# Patient Record
Sex: Female | Born: 1990 | Race: White | Hispanic: No | Marital: Single
Health system: Southern US, Academic
[De-identification: ages and names within clinical notes are randomized; demographics above are authoritative.]

## PROBLEM LIST (undated history)

## (undated) DIAGNOSIS — F1910 Other psychoactive substance abuse, uncomplicated: Secondary | ICD-10-CM

## (undated) DIAGNOSIS — Z789 Other specified health status: Secondary | ICD-10-CM

---
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
## 2011-03-17 LAB — GC/CHLAMYDIA PROBE AMP, URINE
Chlamydia, Swab/Urine, PCR: NEGATIVE
GC Probe Amp, Urine: NEGATIVE

## 2011-03-17 LAB — T4, FREE: Free T4: 0.95

## 2011-03-17 LAB — URINALYSIS, ROUTINE W REFLEX MICROSCOPIC
Glucose, UA: NEGATIVE
Hgb urine dipstick: NEGATIVE
Specific Gravity, Urine: 1.032 — ABNORMAL HIGH

## 2011-03-17 LAB — HEPATIC FUNCTION PANEL
Bilirubin, Direct: 0.1
Total Bilirubin: 0.7

## 2011-03-17 LAB — GAMMA GT: GGT: 11

## 2011-03-17 LAB — URINE CULTURE: Special Requests: NEGATIVE

## 2011-03-17 LAB — RPR: RPR Ser Ql: NONREACTIVE

## 2011-03-29 ENCOUNTER — Observation Stay: Payer: Self-pay | Admitting: Obstetrics and Gynecology

## 2011-06-03 ENCOUNTER — Observation Stay: Payer: Self-pay | Admitting: Obstetrics and Gynecology

## 2011-06-19 ENCOUNTER — Observation Stay: Payer: Self-pay

## 2011-06-29 ENCOUNTER — Observation Stay: Payer: Self-pay

## 2011-06-29 LAB — URINALYSIS, COMPLETE
Bilirubin,UR: NEGATIVE
Glucose,UR: NEGATIVE mg/dL (ref 0–75)
Ketone: NEGATIVE
Specific Gravity: 1.004 (ref 1.003–1.030)
WBC UR: 2 /HPF (ref 0–5)

## 2011-07-05 ENCOUNTER — Observation Stay: Payer: Self-pay | Admitting: Obstetrics and Gynecology

## 2011-07-06 ENCOUNTER — Observation Stay: Payer: Self-pay

## 2011-07-14 ENCOUNTER — Observation Stay: Payer: Self-pay

## 2011-07-14 LAB — CBC WITH DIFFERENTIAL/PLATELET
Basophil %: 0.3 %
Eosinophil %: 1.3 %
Lymphocyte #: 2.1 10*3/uL (ref 1.0–3.6)
MCH: 31.3 pg (ref 26.0–34.0)
MCHC: 33.9 g/dL (ref 32.0–36.0)
MCV: 92 fL (ref 80–100)
Monocyte #: 0.9 10*3/uL — ABNORMAL HIGH (ref 0.0–0.7)
Neutrophil #: 7.1 10*3/uL — ABNORMAL HIGH (ref 1.4–6.5)

## 2011-07-17 ENCOUNTER — Observation Stay: Payer: Self-pay | Admitting: Obstetrics and Gynecology

## 2011-07-18 ENCOUNTER — Inpatient Hospital Stay: Payer: Self-pay

## 2011-07-19 LAB — CBC WITH DIFFERENTIAL/PLATELET
Basophil %: 0.1 %
Eosinophil %: 0.1 %
HCT: 36.1 % (ref 35.0–47.0)
HGB: 12.3 g/dL (ref 12.0–16.0)
Lymphocyte #: 1.4 10*3/uL (ref 1.0–3.6)
MCH: 31.1 pg (ref 26.0–34.0)
MCV: 92 fL (ref 80–100)
Monocyte #: 0.9 10*3/uL — ABNORMAL HIGH (ref 0.0–0.7)
Monocyte %: 5.8 %
Platelet: 197 10*3/uL (ref 150–440)
RBC: 3.94 10*6/uL (ref 3.80–5.20)
WBC: 16.1 10*3/uL — ABNORMAL HIGH (ref 3.6–11.0)

## 2011-07-20 LAB — HEMATOCRIT: HCT: 31.3 % — ABNORMAL LOW (ref 35.0–47.0)

## 2013-04-06 ENCOUNTER — Emergency Department: Payer: Self-pay | Admitting: Emergency Medicine

## 2013-04-06 LAB — URINALYSIS, COMPLETE
Bilirubin,UR: NEGATIVE
Ketone: NEGATIVE
Nitrite: POSITIVE
Protein: NEGATIVE
RBC,UR: 11 /HPF (ref 0–5)
Specific Gravity: 1.017 (ref 1.003–1.030)
Squamous Epithelial: 17
WBC UR: 70 /HPF (ref 0–5)

## 2013-04-06 LAB — HCG, QUANTITATIVE, PREGNANCY: Beta Hcg, Quant.: 19398 m[IU]/mL — ABNORMAL HIGH

## 2013-04-06 LAB — COMPREHENSIVE METABOLIC PANEL
Anion Gap: 5 — ABNORMAL LOW (ref 7–16)
Chloride: 110 mmol/L — ABNORMAL HIGH (ref 98–107)
Co2: 24 mmol/L (ref 21–32)
Creatinine: 0.71 mg/dL (ref 0.60–1.30)
EGFR (African American): >60
EGFR (Non-African Amer.): >60
Osmolality: 274 (ref 275–301)
Potassium: 3.2 mmol/L — ABNORMAL LOW (ref 3.5–5.1)

## 2013-04-06 LAB — CBC
MCH: 30.8 pg (ref 26.0–34.0)
MCHC: 35 g/dL (ref 32.0–36.0)
MCV: 88 fL (ref 80–100)
RBC: 4.21 10*6/uL (ref 3.80–5.20)
RDW: 13.3 % (ref 11.5–14.5)

## 2013-04-06 LAB — WET PREP, GENITAL

## 2013-07-14 ENCOUNTER — Ambulatory Visit: Payer: Self-pay | Admitting: Family Medicine

## 2013-09-20 ENCOUNTER — Observation Stay: Payer: Self-pay | Admitting: Obstetrics and Gynecology

## 2013-09-20 LAB — URINALYSIS, COMPLETE
BILIRUBIN, UR: NEGATIVE
GLUCOSE, UR: NEGATIVE mg/dL (ref 0–75)
KETONE: NEGATIVE
NITRITE: POSITIVE
PROTEIN: NEGATIVE
Ph: 7 (ref 4.5–8.0)
Specific Gravity: 1.01 (ref 1.003–1.030)
Squamous Epithelial: 16

## 2013-09-22 LAB — URINE CULTURE

## 2013-11-06 ENCOUNTER — Observation Stay: Payer: Self-pay

## 2013-11-09 ENCOUNTER — Observation Stay: Payer: Self-pay

## 2013-11-13 ENCOUNTER — Observation Stay: Payer: Self-pay

## 2013-11-15 ENCOUNTER — Ambulatory Visit: Payer: Self-pay | Admitting: Obstetrics and Gynecology

## 2013-11-16 ENCOUNTER — Observation Stay: Payer: Self-pay | Admitting: Obstetrics and Gynecology

## 2013-11-20 ENCOUNTER — Observation Stay: Payer: Self-pay | Admitting: Obstetrics and Gynecology

## 2013-11-23 ENCOUNTER — Inpatient Hospital Stay: Payer: Self-pay | Admitting: Obstetrics and Gynecology

## 2013-11-23 LAB — CBC WITH DIFFERENTIAL/PLATELET
BASOS PCT: 0.4 %
Basophil #: 0 10*3/uL (ref 0.0–0.1)
EOS ABS: 0.2 10*3/uL (ref 0.0–0.7)
EOS PCT: 1.9 %
HCT: 32.8 % — AB (ref 35.0–47.0)
HGB: 10.9 g/dL — ABNORMAL LOW (ref 12.0–16.0)
LYMPHS ABS: 2.4 10*3/uL (ref 1.0–3.6)
LYMPHS PCT: 27.5 %
MCH: 30 pg (ref 26.0–34.0)
MCHC: 33.3 g/dL (ref 32.0–36.0)
MCV: 90 fL (ref 80–100)
MONOS PCT: 7.5 %
Monocyte #: 0.7 x10 3/mm (ref 0.2–0.9)
NEUTROS ABS: 5.5 10*3/uL (ref 1.4–6.5)
NEUTROS PCT: 62.7 %
PLATELETS: 144 10*3/uL — AB (ref 150–440)
RBC: 3.64 10*6/uL — AB (ref 3.80–5.20)
RDW: 13.4 % (ref 11.5–14.5)
WBC: 8.8 10*3/uL (ref 3.6–11.0)

## 2013-11-24 LAB — GC/CHLAMYDIA PROBE AMP

## 2013-11-25 LAB — HEMATOCRIT: HCT: 28.7 % — AB (ref 35.0–47.0)

## 2014-10-16 NOTE — H&P (Signed)
L&D Evaluation:  History Expanded:   HPI 24 yo G1 presents at term with contractions.    Blood Type O negative    Group B Strep Results (Result >5wks must be treated as unknown) negative    Maternal HIV Negative    Maternal Syphilis Ab Nonreactive    Maternal Varicella Equivocal    Rubella Results immune    Presents with contractions    Patient's Medical History No Chronic Illness    Patient's Surgical History vaginal laceration    Medications Pre Natal Vitamins    Allergies augmentin, lates    Social History tobacco   ROS:   ROS All systems were reviewed.  HEENT, CNS, GI, GU, Respiratory, CV, Renal and Musculoskeletal systems were found to be normal.   Exam:   Vital Signs stable    General no apparent distress    Mental Status clear    Chest clear    Heart normal sinus rhythm    Abdomen gravid, non-tender    Estimated Fetal Weight Average for gestational age    Edema 1+    Reflexes 2+    Pelvic 90% but tight FT, -2    Mebranes Intact    FHT normal rate with no decels   Impression:   Impression early labor   Plan:   Plan monitor contractions and for cervical change   Electronic Signatures: Towana Badgerosenow, Corneisha Alvi J (MD)  (Signed 05-Feb-13 12:00)  Authored: L&D Evaluation   Last Updated: 05-Feb-13 12:00 by Towana Badgerosenow, Zannie Runkle J (MD)

## 2014-10-16 NOTE — H&P (Signed)
L&D Evaluation:  History:  HPI 24 y/o G2P1001 @ 40/2wks EDC 11/21/13 here for scheduled NST due to S<D smoker 1/2ppd, HX Trich rx'd, lapse of PNC x 6 months, Size less than dates US 11/02/13 17% growth AFI 13cm repeat scan 11/15/13 12% growth AFI NL. [facility]. Anemia. GBS awaiting [facility] from [facility]. Irregular mild uc's past 24hrs pelvic pressure back pain, small amount spotting, denies leaking fluid, baby is active.   Presents with NST   Patient's Medical History No Chronic Illness   Patient's Surgical History none   Medications Pre Natal Vitamins   Allergies NKDA   Social History none   Family History Non-Contributory   ROS:  ROS All systems were reviewed.  HEENT, CNS, GI, GU, Respiratory, CV, Renal and Musculoskeletal systems were found to be normal.   Exam:  Vital Signs stable   Urine Protein not completed   General no apparent distress   Mental Status clear   Chest clear   Heart normal sinus rhythm   Abdomen gravid, non-tender   Estimated Fetal Weight Small for gestational age   Fetal Position vtx   Fundal Height SGA   Back no CVAT   Edema no edema   Reflexes 1+   Clonus negative   Pelvic no external lesions, cx posterior 3cm 50% vtx @ -1 small show BOWI   Mebranes Intact   FHT normal rate with no decels, baseline 120's 130's avg variability with accels   Fetal Heart Rate 136   Ucx irregular, mild   Skin dry   Lymph no lymphadenopathy   Impression:  Impression early labor, IOL SGA   Plan:  Plan EFM/NST, monitor contractions and for cervical change   Comments Admitted, recommend IOL due to poor growth and smoker at term. Explained risks and benefit, consent obtained. knows what to expect 2nd baby, plans epidural with labor progress. FOB supportive at bedside. Await GBS labs [facility].   Electronic Signatures: Albertina ParrLugiano, Issachar Broady B (CNM)  (Signed 18-Jun-15 16:38)  Authored: L&D  Evaluation   Last Updated: 18-Jun-15 16:38 by Albertina ParrLugiano, Jaclynne Baldo B (CNM)

## 2014-10-16 NOTE — H&P (Signed)
L&D Evaluation:  History:   HPI 24 yo G1P0 with EDC=07/14/2011 by LMP=10/07/2010  who presents with c/o N/V/D since yesterday. Vomited once yesterday but continues to have nausea and decreased appetite. Has also had two loose stools yesterday and today and some lower abdominal cramping. Denies fever, chills, blood in stool or vomitus, vaginal  bleeding, or LOF. Fetal movement normal. Contractions irregular. [facility] remarkable for smoking, an admission for preterm contractions and UTI at 25 weeks, being RH negative and receiving Rhogam, a normal anatomy screen, and being varicella nonimmune.    Presents with back pain, contractions    Patient's Medical History uti/depression    Patient's Surgical History none    Medications Pre [place]atal Vitamins    Allergies other, Augmentin-diarrhea    Social History tobacco  <1 PPD    Family History Non-Contributory   ROS:   ROS see HPI   Exam:   Vital Signs stable  afebrile    Urine Protein negative dipstick, ketones negative    General no apparent distress    Mental Status clear    Chest clear    Heart normal sinus rhythm, no murmur/gallop/rubs    Abdomen gravid, non-tender, BS active, upper abdomen NT    Estimated Fetal Weight Average for gestational age    Fetal Position vtx    Edema no edema    Reflexes 2+    Pelvic no external lesions, cervix closed and thick    Mebranes Intact    FHT normal rate with no decels    Ucx irregular    Skin dry    Other Lips cracked and oral mucous membranes moist.   Impression:   Impression N/V/D-mild-probable viral AGE   Plan:   Plan IV hydration and IV Zofran. BRAT diet. [facility] if labor room [facility]. DC ext monitor   Electronic Signatures: Trinna BalloonGutierrez, Arra Connaughton L (CNM)  (Signed 22-Jan-13 08:22)  Authored: L&D Evaluation   Last Updated: 22-Jan-13 08:22 by Trinna BalloonGutierrez, Tenya Araque L (CNM)

## 2014-10-16 NOTE — H&P (Signed)
L&D Evaluation:  History:   HPI 24 yo G1 W@ [id number] GA dated by LMP and 20 week scan. Her pregnancy has been complicated by Rh negative blood type and smoking. She presents with regular uterine contractions since this morning that have been getting closer together. +FM, no LOF, no VB. O-, RI, RPR NR, HBsAg neg, V Eq, GBS neg.    Presents with contractions    Patient's Medical History No Chronic Illness    Patient's Surgical History none    Medications Pre Natal Vitamins    Allergies Augmentin    Social History tobacco    Family History Non-Contributory   ROS:   ROS All systems were reviewed.  HEENT, CNS, GI, GU, Respiratory, CV, Renal and Musculoskeletal systems were found to be normal., unless noted in HPI   Exam:   Vital Signs stable    Urine Protein not completed    General no apparent distress    Mental Status clear    Chest clear    Heart normal sinus rhythm    Abdomen gravid, tender with contractions    Estimated Fetal Weight Average for gestational age    Fetal Position cephalic    Back no CVAT    Edema no edema    Pelvic 4/50/0 per RN    Mebranes Intact    FHT normal rate with no decels    FHT Description 130/+accels/mod var/no decels    Ucx irregular    Skin no lesions   Impression:   Impression active labor   Plan:   Plan EFM/NST, monitor contractions and for cervical change, fluids    Comments admit for labor cbc/t&s ivf/clears patient plans for epidural will manage expectantly initially   Electronic Signatures: Conard NovakJackson, Yuniel Blaney D (MD)  (Signed 10-Feb-13 00:08)  Authored: L&D Evaluation   Last Updated: 10-Feb-13 00:08 by Conard NovakJackson, Raevyn Sokol D (MD)

## 2015-03-27 ENCOUNTER — Emergency Department: Payer: Self-pay

## 2015-03-27 ENCOUNTER — Inpatient Hospital Stay
Admission: EM | Admit: 2015-03-27 | Discharge: 2015-04-01 | DRG: 872 | Disposition: A | Discharge status: Home / Self Care | Payer: Self-pay | Attending: Obstetrics and Gynecology | Admitting: Obstetrics and Gynecology

## 2015-03-27 DIAGNOSIS — B961 Klebsiella pneumoniae [K. pneumoniae] as the cause of diseases classified elsewhere: Secondary | ICD-10-CM | POA: Diagnosis present

## 2015-03-27 DIAGNOSIS — N760 Acute vaginitis: Secondary | ICD-10-CM

## 2015-03-27 DIAGNOSIS — F17200 Nicotine dependence, unspecified, uncomplicated: Secondary | ICD-10-CM | POA: Diagnosis present

## 2015-03-27 DIAGNOSIS — B9689 Other specified bacterial agents as the cause of diseases classified elsewhere: Secondary | ICD-10-CM

## 2015-03-27 DIAGNOSIS — D509 Iron deficiency anemia, unspecified: Secondary | ICD-10-CM | POA: Diagnosis present

## 2015-03-27 DIAGNOSIS — A419 Sepsis, unspecified organism: Principal | ICD-10-CM | POA: Diagnosis present

## 2015-03-27 DIAGNOSIS — Z452 Encounter for adjustment and management of vascular access device: Secondary | ICD-10-CM

## 2015-03-27 DIAGNOSIS — N730 Acute parametritis and pelvic cellulitis: Secondary | ICD-10-CM

## 2015-03-27 DIAGNOSIS — N7093 Salpingitis and oophoritis, unspecified: Secondary | ICD-10-CM | POA: Diagnosis present

## 2015-03-27 DIAGNOSIS — N390 Urinary tract infection, site not specified: Secondary | ICD-10-CM | POA: Diagnosis present

## 2015-03-27 DIAGNOSIS — Z888 Allergy status to other drugs, medicaments and biological substances status: Secondary | ICD-10-CM

## 2015-03-27 DIAGNOSIS — D649 Anemia, unspecified: Secondary | ICD-10-CM | POA: Diagnosis present

## 2015-03-27 LAB — HCG, QUANTITATIVE, PREGNANCY: hCG, Beta Chain, Quant, S: <1 m[IU]/mL (ref <5)

## 2015-03-27 LAB — COMPREHENSIVE METABOLIC PANEL
ALK PHOS: 83 U/L (ref 38–126)
ALT: 9 U/L — AB (ref 14–54)
AST: 17 U/L (ref 15–41)
Albumin: 3.8 g/dL (ref 3.5–5.0)
Anion gap: 11 (ref 5–15)
BILIRUBIN TOTAL: 0.5 mg/dL (ref 0.3–1.2)
BUN: 15 mg/dL (ref 6–20)
CALCIUM: 8.5 mg/dL — AB (ref 8.9–10.3)
CO2: 23 mmol/L (ref 22–32)
CREATININE: 0.83 mg/dL (ref 0.44–1.00)
Chloride: 100 mmol/L — ABNORMAL LOW (ref 101–111)
GFR calc Af Amer: >60 mL/min (ref >60)
Glucose, Bld: 92 mg/dL (ref 65–99)
POTASSIUM: 3.2 mmol/L — AB (ref 3.5–5.1)
Sodium: 134 mmol/L — ABNORMAL LOW (ref 135–145)
TOTAL PROTEIN: 7.6 g/dL (ref 6.5–8.1)

## 2015-03-27 LAB — CBC WITH DIFFERENTIAL/PLATELET
BASOS ABS: 0.1 10*3/uL (ref 0–0.1)
Basophils Relative: 0 %
Eosinophils Absolute: 0 10*3/uL (ref 0–0.7)
Eosinophils Relative: 0 %
HEMATOCRIT: 24.5 % — AB (ref 35.0–47.0)
Hemoglobin: 7.7 g/dL — ABNORMAL LOW (ref 12.0–16.0)
LYMPHS PCT: 4 %
Lymphs Abs: 0.8 10*3/uL — ABNORMAL LOW (ref 1.0–3.6)
MCH: 21.5 pg — ABNORMAL LOW (ref 26.0–34.0)
MCHC: 31.6 g/dL — ABNORMAL LOW (ref 32.0–36.0)
MCV: 68.1 fL — AB (ref 80.0–100.0)
MONO ABS: 1.4 10*3/uL — AB (ref 0.2–0.9)
MONOS PCT: 8 %
NEUTROS ABS: 16 10*3/uL — AB (ref 1.4–6.5)
Neutrophils Relative %: 88 %
Platelets: 303 10*3/uL (ref 150–440)
RBC: 3.59 MIL/uL — ABNORMAL LOW (ref 3.80–5.20)
RDW: 17.6 % — AB (ref 11.5–14.5)
WBC: 18.2 10*3/uL — ABNORMAL HIGH (ref 3.6–11.0)

## 2015-03-27 LAB — URINALYSIS COMPLETE WITH MICROSCOPIC (ARMC ONLY)
Bilirubin Urine: NEGATIVE
Glucose, UA: NEGATIVE mg/dL
NITRITE: POSITIVE — AB
PROTEIN: 100 mg/dL — AB
Specific Gravity, Urine: 1.028 (ref 1.005–1.030)
pH: 5 (ref 5.0–8.0)

## 2015-03-27 LAB — LACTIC ACID, PLASMA: LACTIC ACID, VENOUS: 1.3 mmol/L (ref 0.5–2.0)

## 2015-03-27 LAB — ABO/RH: ABO/RH(D): O NEG

## 2015-03-27 LAB — LIPASE, BLOOD: LIPASE: 15 U/L — AB (ref 22–51)

## 2015-03-27 MED ORDER — MORPHINE SULFATE (PF) 4 MG/ML IV SOLN
4.0000 mg | Freq: Once | INTRAVENOUS | Status: AC
  Administered 2015-03-27: 4 mg via INTRAVENOUS
  Filled 2015-03-27: qty 1

## 2015-03-27 MED ORDER — IOHEXOL 300 MG/ML  SOLN
75.0000 mL | Freq: Once | INTRAMUSCULAR | Status: AC | PRN
  Administered 2015-03-27: 75 mL via INTRAVENOUS
  Filled 2015-03-27: qty 75

## 2015-03-27 MED ORDER — DEXTROSE 5 % IV SOLN
1.0000 g | Freq: Once | INTRAVENOUS | Status: AC
  Administered 2015-03-27: 1 g via INTRAVENOUS
  Filled 2015-03-27: qty 10

## 2015-03-27 MED ORDER — ONDANSETRON HCL 4 MG/2ML IJ SOLN
4.0000 mg | Freq: Once | INTRAMUSCULAR | Status: AC
  Administered 2015-03-27: 4 mg via INTRAVENOUS
  Filled 2015-03-27: qty 2

## 2015-03-27 MED ORDER — DEXTROSE 5 % IV SOLN: 1.0000 g | INTRAVENOUS | Status: DC

## 2015-03-27 MED ORDER — METRONIDAZOLE IN NACL 5-0.79 MG/ML-% IV SOLN
500.0000 mg | Freq: Once | INTRAVENOUS | Status: AC
  Administered 2015-03-27: 500 mg via INTRAVENOUS
  Filled 2015-03-27 (×2): qty 100

## 2015-03-27 MED ORDER — SODIUM CHLORIDE 0.9 % IV BOLUS (SEPSIS)
1000.0000 mL | INTRAVENOUS | Status: DC
  Administered 2015-03-27: 1000 mL via INTRAVENOUS

## 2015-03-27 MED ORDER — SODIUM CHLORIDE 0.9 % IV SOLN
Freq: Once | INTRAVENOUS | Status: AC
  Administered 2015-03-28: 01:00:00 via INTRAVENOUS

## 2015-03-27 MED ORDER — DOXYCYCLINE HYCLATE 100 MG IV SOLR
100.0000 mg | Freq: Once | INTRAVENOUS | Status: AC
  Administered 2015-03-28: 100 mg via INTRAVENOUS
  Filled 2015-03-27: qty 100

## 2015-03-27 MED ORDER — IOHEXOL 240 MG/ML SOLN
50.0000 mL | Freq: Once | INTRAMUSCULAR | Status: AC | PRN
  Administered 2015-03-27: 50 mL via ORAL
  Filled 2015-03-27: qty 50

## 2015-03-27 NOTE — ED Provider Notes (Addendum)
[facility] Emergency Department Provider Note  ____________________________________________  Time seen: Approximately 8:37 PM  I have reviewed the triage vital signs and the nursing notes.   HISTORY  Chief Complaint Abdominal Pain    HPI [name] is a 24 y.o. female with no significant past medical history who presents with right lower quadrant abdominal pain that has been worsening for 2-3 days.  Now it involves her entire lower abdomen.  She reports that it is severe, constant, but worse with any kind of movement.  Nothing makes it better.  She has had irregular menstrual periods for some time but she has had 2 home pregnancy tests which were negative.  She denies fever/chills, chest pain, shortness of breath.  She denies dysuria.  She had 2 days of vaginal bleeding about one week ago but it was lighter than normal and has since stopped.She has nausea and vomiting that began today.   History reviewed. No pertinent past medical history.  There are no active problems to display for this patient.   History reviewed. No pertinent past surgical history.  Current Outpatient Rx  Name  Route  Sig  Dispense  Refill  . ibuprofen (ADVIL,MOTRIN) 200 MG tablet   Oral   Take 200 mg by mouth every 6 (six) hours as needed for moderate pain.           Allergies Augmentin  History reviewed. No pertinent family history.  Social History Social History  Substance Use Topics  . Smoking status: Current Every Day Smoker  . Smokeless tobacco: None  . Alcohol Use: No   The patient states that she has one sexual partner, her fianc, and that they use condoms.    Review of Systems Constitutional: No fever/chills Eyes: No visual changes. ENT: No sore throat. Cardiovascular: Denies chest pain. Respiratory: Denies shortness of breath. Gastrointestinal: Severe abdominal pain throughout the lower abdomen but worse in the right lower quadrant.  Nausea and  vomiting starting today Genitourinary: Negative for dysuria. Musculoskeletal: Negative for back pain. Skin: Negative for rash. Neurological: Negative for headaches, focal weakness or numbness.  10-point ROS otherwise negative.  ____________________________________________   PHYSICAL EXAM:  VITAL SIGNS: ED Triage Vitals  Enc Vitals Group     BP 03/27/15 1852 108/60 mmHg     Pulse Rate 03/27/15 1852 101     Resp 03/27/15 1852 16     Temp 03/27/15 1852 98.1 F (36.7 C)     Temp Source 03/27/15 1852 Oral     SpO2 03/27/15 1852 100 %     Weight 03/27/15 1852 115 lb (52.164 kg)     Height 03/27/15 1852  (1.626 m)     Head Cir --      Peak Flow --      Pain Score 03/27/15 1852 10     Pain Loc --      Pain Edu? --      Excl. in GC? --     Constitutional: Alert and oriented.  Appears uncomfortable and in pain Eyes: Conjunctivae are normal. PERRL. EOMI. Head: Atraumatic. Nose: No congestion/rhinnorhea. Mouth/Throat: Mucous membranes are moist.  Oropharynx non-erythematous. Neck: No stridor.   Cardiovascular: Mild tachycardia, regular rhythm. Grossly normal heart sounds.  Good peripheral circulation. Respiratory: Normal respiratory effort.  No retractions. Lungs CTAB. Gastrointestinal: severe tenderness to palpation throughout the lower abdomen but worse on the right.  Positive rebound and guarding. Genitourinary: Normal external exam.  Speculum exam was exquisitely tender for the  patient.  There is copious yellow discharge in the vaginal vault.  There is no obvious cervicitis but the exam is limited by the patient's pain/discomfort.  Bimanual exam was also exquisitely tender with adnexal tenderness most notable on the right and with only mild cervical motion tenderness. Musculoskeletal: No lower extremity tenderness nor edema.  No joint effusions. Neurologic:  Normal speech and language. No gross focal neurologic deficits are appreciated.  Skin:  Skin is warm, dry and intact.  No rash noted.  Appears pale Psychiatric: Mood and affect are normal. Speech and behavior are normal.  ____________________________________________   LABS (all labs ordered are listed, but only abnormal results are displayed)  Labs Reviewed  WET PREP, GENITAL - Abnormal; Notable for the following:    Clue Cells Wet Prep HPF POC POSITIVE (*)    WBC, Wet Prep HPF POC MANY (*)    All other components within normal limits  CBC WITH DIFFERENTIAL/PLATELET - Abnormal; Notable for the following:    WBC 18.2 (*)    RBC 3.59 (*)    Hemoglobin 7.7 (*)    HCT 24.5 (*)    MCV 68.1 (*)    MCH 21.5 (*)    MCHC 31.6 (*)    RDW 17.6 (*)    Neutro Abs 16.0 (*)    Lymphs Abs 0.8 (*)    Monocytes Absolute 1.4 (*)    All other components within normal limits  COMPREHENSIVE METABOLIC PANEL - Abnormal; Notable for the following:    Sodium 134 (*)    Potassium 3.2 (*)    Chloride 100 (*)    Calcium 8.5 (*)    ALT 9 (*)    All other components within normal limits  LIPASE, BLOOD - Abnormal; Notable for the following:    Lipase 15 (*)    All other components within normal limits  URINALYSIS COMPLETEWITH MICROSCOPIC ([facility] ONLY) - Abnormal; Notable for the following:    Color, Urine AMBER (*)    APPearance CLOUDY (*)    Ketones, ur 1+ (*)    Hgb urine dipstick 2+ (*)    Protein, ur 100 (*)    Nitrite POSITIVE (*)    Leukocytes, UA 3+ (*)    Bacteria, UA MANY (*)    Squamous Epithelial / LPF 6-30 (*)    All other components within normal limits  URINE CULTURE  CHLAMYDIA/NGC RT PCR ([facility] ONLY)  LACTIC ACID, PLASMA  HCG, QUANTITATIVE, PREGNANCY  LACTIC ACID, PLASMA  POC URINE PREG, ED  TYPE AND SCREEN  ABO/RH   ____________________________________________  EKG  Not indicated ____________________________________________  RADIOLOGY   Ct Abdomen Pelvis W Contrast  03/27/2015  CLINICAL DATA:  Severe lower abdominal pain. Acute anemia and leukocytosis. EXAM: CT ABDOMEN AND  PELVIS WITH CONTRAST TECHNIQUE: Multidetector CT imaging of the abdomen and pelvis was performed using the standard protocol following bolus administration of intravenous contrast. CONTRAST:  75mL OMNIPAQUE IOHEXOL 300 MG/ML  SOLN COMPARISON:  None. FINDINGS: Lower chest:  The included lung bases are clear. Liver: Normal, no focal lesion. Hepatobiliary: Gallbladder physiologically distended, no calcified stones. No biliary dilatation. Pancreas: Normal. Spleen: Normal. Adrenal glands: No nodule. Kidneys: Symmetric renal enhancement. No hydronephrosis. No perinephric stranding or focal renal abnormality. Stomach/Bowel: Stomach physiologically distended. There are no dilated or thickened small bowel loops. Small volume of stool throughout the colon without colonic wall thickening. The appendix is not confidently identified. Vascular/Lymphatic: No retroperitoneal adenopathy. Abdominal aorta is normal in caliber. Reproductive: Diffuse pelvic soft tissue  stranding. Bilateral adnexal irregularly-shaped multiloculated fluid collections. This appears larger on the left and extends posteriorly into the midline and right pelvis measuring at least 10 cm. On the right this measures at least 5 cm. The uterus demonstrates heterogeneous enhancement. Bladder: Physiologically distended, no wall thickening. Other: No free air. Musculoskeletal: There are no acute or suspicious osseous abnormalities. IMPRESSION: Bilateral multiloculated pelvic/adnexal fluid collections, most concerning for bilateral hydro/pyosalpinx and tubo-ovarian abscess. Electronically Signed   By: Rubye Oaks M.D.   On: 03/27/2015 22:37    ____________________________________________   PROCEDURES  Procedure(s) performed: bedside FAST, see procedure note(s).   FAST BEDSIDE US Indication: possible acute abdomen  3 Views obtained: Splenorenal, Morrison's Pouch, Retrovesical No free fluid in abdomen No difficulty obtaining views. I personally  performed and interpreted the images   Critical Care performed: Yes, see critical care note(s)   CRITICAL CARE Performed by: Loleta Rose   Total critical care time: 45 minutes  Critical care time was exclusive of separately billable procedures and treating other patients.  Critical care was necessary to treat or prevent imminent or life-threatening deterioration.  Critical care was time spent personally by me on the following activities: development of treatment plan with patient and/or surrogate as well as nursing, discussions with consultants, evaluation of patient's response to treatment, examination of patient, obtaining history from patient or surrogate, ordering and performing treatments and interventions, ordering and review of laboratory studies, ordering and review of radiographic studies, pulse oximetry and re-evaluation of patient's condition.  ____________________________________________   INITIAL IMPRESSION / ASSESSMENT AND PLAN / ED COURSE  Pertinent labs & imaging results that were available during my care of the patient were reviewed by me and considered in my medical decision making (see chart for details).  I am concerned about the patient's presentation with tachycardia and severe abdominal tenderness.  My bedside fast did not reveal any free fluid in the abdomen which is reassuring.  Her urine pregnancy test was negative.  Her urinalysis is grossly infected and this may all represent pyelonephritis, but given the tenderness of the abdomen I am going to give empiric antibiotics (ceftriaxone 2 g for urinary source  and Flagyl 500 mg for possible intraabdominal source), 30 mL/kg of normal saline, and obtain a stat CT abdomen and pelvis.  The patient has not had any vaginal symptoms and her history of present illness is not consistent with an ovarian torsion, I am more concerned about appendicitis or other intra-abdominal, non-gynecological pathology in addition to her  urinary tract infection  ----------------------------------------- 9:33 PM on 03/27/2015 -----------------------------------------  The patient's hemoglobin is 7.7 which is significantly lower than prior studies.  I ordered a type and screen, but again, she has no free fluid on the FAST exam.  I am awaiting the CT scan.  ----------------------------------------- 10:53 PM on 03/27/2015 -----------------------------------------  The patient's urine is grossly infected and her CT scan is consistent with tubo-ovarian abscesses/bilateral pyosalpinx.  I have paged the GYN doctor, Dr. Tildon Husky, to discuss the case and requested admission.  I am broadening the patient's antibiotic coverage to include doxycycline.  I have not performed a pelvic exam yet because I will discuss it with Dr. Tildon Husky, but I will obtain swabs as needed if she is unavailable.  ----------------------------------------- 11:38 PM on 03/27/2015 -----------------------------------------  Spoke by phone with Dr. Tildon Husky.  She asked that I go ahead and proceed with the pelvic exam and obtaining swabs and cultures.  I will do so now and Dr. Tildon Husky will admit  the patient for further management.  ----------------------------------------- 2:22 AM on 03/28/2015 -----------------------------------------  The patient has already been admitted, but I followed up the GC/Chlamydia results, and both are positive.  The patient has already received appropriate ED treatment that includes a cephalosporin (ceftriaxone 2 g IV) and doxycycline 100 mg IV.  She also received metronidazole 500 mg IV for additional coverage that will also treat her BV. ____________________________________________  FINAL CLINICAL IMPRESSION(S) / ED DIAGNOSES  Final diagnoses:  Sepsis, due to unspecified organism (HCC)  TOA (tubo-ovarian abscess)  Complicated UTI (urinary tract infection)  Bacterial vaginosis  Anemia, unspecified anemia type  Acute pelvic  inflammatory disease (PID)      NEW MEDICATIONS STARTED DURING THIS VISIT:  New Prescriptions   No medications on file     Loleta Roseory Brianna Bennett, MD 03/28/15 0020  Loleta Roseory Merlinda Wrubel, MD 03/28/15 [phone]

## 2015-03-27 NOTE — ED Notes (Signed)
Pt poct negative

## 2015-03-27 NOTE — Progress Notes (Signed)
ANTIBIOTIC CONSULT NOTE - INITIAL  Pharmacy Consult for Ceftriaxone  Indication: UTI  Allergies  Allergen Reactions  . Augmentin [Amoxicillin-Pot Clavulanate] Itching    Patient Measurements: Height: 5\' 4"  (162.6 cm) Weight: 115 lb (52.164 kg) IBW/kg (Calculated) : 54.7 Adjusted Body Weight:   Vital Signs: Temp: 98.1 F (36.7 C) (10/19 1852) Temp Source: Oral (10/19 1852) BP: 108/60 mmHg (10/19 1852) Pulse Rate: 101 (10/19 1852) Intake/Output from previous day:   Intake/Output from this shift:    Labs:  Recent Labs  03/27/15 1926  WBC 18.2*  HGB 7.7*  PLT 303  CREATININE 0.83   Estimated Creatinine Clearance: 86.9 mL/min (by C-G formula based on Cr of 0.83). No results for input(s): VANCOTROUGH, VANCOPEAK, VANCORANDOM, GENTTROUGH, GENTPEAK, GENTRANDOM, TOBRATROUGH, TOBRAPEAK, TOBRARND, AMIKACINPEAK, AMIKACINTROU, AMIKACIN in the last 72 hours.   Microbiology: No results found for this or any previous visit (from the past 720 hour(s)).  Medical History: History reviewed. No pertinent past medical history.  Medications:   (Not in a hospital admission) Assessment: CrCl= 86.9 ml/min  Goal of Therapy:  Resolution of infection.  Plan:  Will start Ceftriaxone 1 gm IV Q24H .   Kenzie Flakes D 03/27/2015,9:16 PM

## 2015-03-27 NOTE — ED Notes (Signed)
Patient with right and left lower abdominal pain for 2-3 days. Patient reports having shorter period than normal which started 03/18/15 and stopped 03/20/15.  Per Patient she took 2 pregnancy test which came back negative.

## 2015-03-28 ENCOUNTER — Encounter: Payer: Self-pay | Admitting: *Deleted

## 2015-03-28 DIAGNOSIS — N7093 Salpingitis and oophoritis, unspecified: Secondary | ICD-10-CM | POA: Diagnosis present

## 2015-03-28 LAB — CBC WITH DIFFERENTIAL/PLATELET
BASOS PCT: 0 %
Basophils Absolute: 0.1 10*3/uL (ref 0–0.1)
Eosinophils Absolute: 0.1 10*3/uL (ref 0–0.7)
Eosinophils Relative: 1 %
HEMATOCRIT: 21 % — AB (ref 35.0–47.0)
HEMOGLOBIN: 6.5 g/dL — AB (ref 12.0–16.0)
LYMPHS ABS: 1.2 10*3/uL (ref 1.0–3.6)
Lymphocytes Relative: 8 %
MCH: 21.5 pg — AB (ref 26.0–34.0)
MCHC: 31.1 g/dL — AB (ref 32.0–36.0)
MCV: 69.1 fL — AB (ref 80.0–100.0)
MONO ABS: 1.4 10*3/uL — AB (ref 0.2–0.9)
MONOS PCT: 9 %
NEUTROS ABS: 12 10*3/uL — AB (ref 1.4–6.5)
Neutrophils Relative %: 82 %
Platelets: 235 10*3/uL (ref 150–440)
RBC: 3.04 MIL/uL — ABNORMAL LOW (ref 3.80–5.20)
RDW: 17.5 % — AB (ref 11.5–14.5)
WBC: 14.7 10*3/uL — ABNORMAL HIGH (ref 3.6–11.0)

## 2015-03-28 LAB — URINE DRUG SCREEN, QUALITATIVE (ARMC ONLY)
AMPHETAMINES, UR SCREEN: NOT DETECTED
BARBITURATES, UR SCREEN: NOT DETECTED
BENZODIAZEPINE, UR SCRN: NOT DETECTED
Cannabinoid 50 Ng, Ur ~~LOC~~: NOT DETECTED
Cocaine Metabolite,Ur ~~LOC~~: NOT DETECTED
MDMA (Ecstasy)Ur Screen: NOT DETECTED
METHADONE SCREEN, URINE: NOT DETECTED
Opiate, Ur Screen: POSITIVE — AB
Phencyclidine (PCP) Ur S: NOT DETECTED
TRICYCLIC, UR SCREEN: NOT DETECTED

## 2015-03-28 LAB — WET PREP, GENITAL
Clue Cells Wet Prep HPF POC: POSITIVE — AB
Trich, Wet Prep: NONE SEEN
Yeast Wet Prep HPF POC: NONE SEEN

## 2015-03-28 LAB — CHLAMYDIA/NGC RT PCR (ARMC ONLY)
Chlamydia Tr: DETECTED — AB
N GONORRHOEAE: DETECTED — AB

## 2015-03-28 LAB — IRON AND TIBC
Iron: 6 ug/dL — ABNORMAL LOW (ref 28–170)
Saturation Ratios: 2 % — ABNORMAL LOW (ref 10.4–31.8)
TIBC: 297 ug/dL (ref 250–450)
UIBC: 291 ug/dL

## 2015-03-28 LAB — LACTIC ACID, PLASMA: Lactic Acid, Venous: 0.7 mmol/L (ref 0.5–2.0)

## 2015-03-28 LAB — PROTIME-INR
INR: 1.15
PROTHROMBIN TIME: 14.9 s (ref 11.4–15.0)

## 2015-03-28 LAB — PREPARE RBC (CROSSMATCH)

## 2015-03-28 MED ORDER — ONDANSETRON HCL 4 MG/2ML IJ SOLN: 4.0000 mg | Freq: Three times a day (TID) | INTRAMUSCULAR | Status: DC | PRN

## 2015-03-28 MED ORDER — DIPHENHYDRAMINE HCL 25 MG PO CAPS
25.0000 mg | ORAL_CAPSULE | Freq: Once | ORAL | Status: AC
  Administered 2015-03-28: 25 mg via ORAL
  Filled 2015-03-28: qty 1

## 2015-03-28 MED ORDER — METRONIDAZOLE IN NACL 5-0.79 MG/ML-% IV SOLN
500.0000 mg | Freq: Three times a day (TID) | INTRAVENOUS | Status: DC
  Administered 2015-03-28 – 2015-03-30 (×7): 500 mg via INTRAVENOUS
  Filled 2015-03-28 (×10): qty 100

## 2015-03-28 MED ORDER — MORPHINE SULFATE (PF) 2 MG/ML IV SOLN
2.0000 mg | INTRAVENOUS | Status: DC | PRN
  Administered 2015-03-28 – 2015-03-29 (×4): 2 mg via INTRAVENOUS
  Filled 2015-03-28 (×4): qty 1

## 2015-03-28 MED ORDER — SODIUM CHLORIDE 0.9 % IV SOLN
Freq: Once | INTRAVENOUS | Status: AC
  Administered 2015-03-29: 02:00:00 via INTRAVENOUS

## 2015-03-28 MED ORDER — ACETAMINOPHEN 500 MG PO TABS
1000.0000 mg | ORAL_TABLET | Freq: Once | ORAL | Status: AC
  Administered 2015-03-28: 1000 mg via ORAL

## 2015-03-28 MED ORDER — FAMOTIDINE IN NACL 20-0.9 MG/50ML-% IV SOLN
20.0000 mg | INTRAVENOUS | Status: DC
  Administered 2015-03-28 – 2015-03-30 (×3): 20 mg via INTRAVENOUS
  Filled 2015-03-28 (×3): qty 50

## 2015-03-28 MED ORDER — SODIUM CHLORIDE 0.9 % IV SOLN
Freq: Once | INTRAVENOUS | Status: AC
  Administered 2015-03-28: 21:00:00 via INTRAVENOUS

## 2015-03-28 MED ORDER — DEXTROSE-NACL 5-0.45 % IV SOLN
INTRAVENOUS | Status: DC
  Administered 2015-03-28 (×3): via INTRAVENOUS

## 2015-03-28 MED ORDER — DOXYCYCLINE HYCLATE 100 MG IV SOLR
100.0000 mg | Freq: Two times a day (BID) | INTRAVENOUS | Status: DC
  Administered 2015-03-28 – 2015-03-30 (×4): 100 mg via INTRAVENOUS
  Filled 2015-03-28 (×7): qty 100

## 2015-03-28 MED ORDER — CEFOXITIN SODIUM 1 G IV SOLR
1.0000 g | Freq: Three times a day (TID) | INTRAVENOUS | Status: AC
  Administered 2015-03-28: 1 g via INTRAVENOUS
  Filled 2015-03-28: qty 1

## 2015-03-28 MED ORDER — DIPHENHYDRAMINE HCL 50 MG/ML IJ SOLN: 12.5000 mg | Freq: Four times a day (QID) | INTRAMUSCULAR | Status: DC | PRN

## 2015-03-28 MED ORDER — SODIUM CHLORIDE 0.9 % IV SOLN
INTRAVENOUS | Status: DC
  Administered 2015-03-28 – 2015-03-29 (×2): via INTRAVENOUS
  Administered 2015-03-31: 10 mL/h via INTRAVENOUS

## 2015-03-28 MED ORDER — ACETAMINOPHEN 325 MG PO TABS
650.0000 mg | ORAL_TABLET | Freq: Once | ORAL | Status: AC
  Administered 2015-03-28: 650 mg via ORAL
  Filled 2015-03-28: qty 2

## 2015-03-28 MED ORDER — ZOLPIDEM TARTRATE 5 MG PO TABS: 5.0000 mg | ORAL_TABLET | Freq: Every evening | ORAL | Status: DC | PRN

## 2015-03-28 NOTE — Progress Notes (Signed)
Notified nurse midwife on call of patient's increased temperature 15 min after start of blood transfusion and more increased temperature 15 min after. Midwife ordered tylenol to be given stat and transfusion to resume. Tylenol given and transfusion resumed. Will continue to monitor.   Imagene ShellerMegan Angeline Trick, RN

## 2015-03-28 NOTE — Progress Notes (Signed)
Milon Scorearon Jones CNM given status report by myself on pt's low BP this am, fluid infusing and detected GC/Chl lab.

## 2015-03-28 NOTE — Progress Notes (Signed)
S: Pt feels some better this evening. No nausea, no vomiting, fever improving. T99.8 BP 99/51 P85 R 20 SaO2 99%. 1st upc cells infused. 2nd unit being hung shortly. A: PID with sepsis (+GC,+chlamydia) 2. Fever 3. Anemia P: Pt to get total of 3 units of pc. Continue Doxy, Cefoxitin and Flaygl. IUnterventional radiology in am. NPO after MN.

## 2015-03-28 NOTE — Progress Notes (Signed)
24yo G2P2 admitted last night for b/l TOA's and sepsis, now improved.   Pain 5/10, slept well last night. No n/v/f/c. No chest pain, dyspnea. Able to get up to the bathroom.   O: BP 91/51 mmHg  Pulse 80  Temp(Src) 98.2 F (36.8 C) (Oral)  Resp 20  Ht 5\' 4"  (1.626 m)  Wt 52.164 kg (115 lb)  BMI 19.73 kg/m2  SpO2 100%  LMP 03/27/2015  GEN: NAD, sleeping  ABD: soft, mod TTP but no guarding this morning in lower abdomen  Labs: Gc + CT + clue +  A/P: 10cm TOA initially admitted with sepsis, now improved on IV abx 1. TOA - contact IR today regarding drain - cont abx for at least 48hrs - monitor BP's - suspect patient has a low BP at baseline given her BMI - NPO until IR  2. UTI - cont abx  3. GC/CT + - patient informed - covered by abx regimen - will need to treat partner  4. Anemia - f/u iron studies   Ala DachJohanna K Halfon, MD

## 2015-03-28 NOTE — ED Notes (Signed)
OBGYN at bedside

## 2015-03-28 NOTE — Progress Notes (Signed)
Called pt to inform of the need for blood transfusion. Pt accepts and agrees to 3 units of packed cells. Also, will let pt have clear liquids for now till 0000. Will start a 2nd IV line for blood transfusion. Pt is aware of the interventional radiology procedure that can be done in am by Radiology. Blood cultures being done. Pt is still on Cefoxitin, Doxycycline and Flagyl. Temp 101 this afternoon. Dr Feliberto GottronSchermerhorn is aware of pt status and agrees with plan of care.

## 2015-03-28 NOTE — H&P (Addendum)
OBGYN ADMISSION NOTE Reason for Consult:TOA  LMP: 03/18/15 UPT: neg  [name] is an 24 y.o. G2P2 female. Here with severe lower abdominal pain starting Monday. On Monday it was 3/10, exacerbated by intercourse. On Tuesday, it was 10/10, not relieved by ibuprofen. No f/c/n/v. Could not tolerate pain, came to ED.  Pertinent Gynecological History: Menses: regular every month without intermenstrual spotting Bleeding: normal Contraception: condoms DES exposure: unknown Blood transfusions: none Sexually transmitted diseases: trichomonas Previous GYN Procedures: none  Last mammogram: N/A Date: N/A Last pap: normal Date: 2014 per patient OB History: G2, P2 - NSVD x 2 (2014, 2012)  Menstrual History: Patient's last menstrual period was 03/27/2015.   History reviewed. No pertinent past medical history.  History reviewed. No pertinent past surgical history.  History reviewed. No pertinent family history.  Social History:  reports that she has been smoking.  She does not have any smokeless tobacco history on file. She reports that she does not drink alcohol. Her drug history is not on file. 1/2 pack a day since age 44  Allergies:  Allergies  Allergen Reactions  . Augmentin [Amoxicillin-Pot Clavulanate] Itching    Medications: Prior to Admission:  (Not in a hospital admission)  ROS  14 system ROS otherwise neg except indicated below Review of Systems - General ROS: positive for  - malaise Respiratory ROS: no cough, shortness of breath, or wheezing Cardiovascular ROS: no chest pain or dyspnea on exertion Gastrointestinal ROS: no abdominal pain, change in bowel habits, or black or bloody stools Genito-Urinary ROS: positive for - change in menstrual cycle, dyspareunia, dysuria and pelvic pain  Blood pressure 99/57, pulse 82, temperature 98.1 F (36.7 C), temperature source Oral, resp. rate 18, height '5\' 4"'  (1.626 m), weight 52.164 kg (115 lb), last menstrual period 03/27/2015,  SpO2 100 %. Physical Exam   General appearance: alert, cooperative, appears stated age and mild distress Head: Normocephalic, without obvious abnormality, atraumatic Eyes: conjunctivae/corneas clear. PERRL, EOM's intact. Fundi benign. Throat: missing teeth, poor dentition Abdomen: abnormal findings:  distended, guarding and moderate tenderness in the lower abdomen Extremities: extremities normal, atraumatic, no cyanosis or edema Skin: Skin color, texture, turgor normal. No rashes or lesions  Results for orders placed or performed during the hospital encounter of 03/27/15 (from the past 48 hour(s))  Urinalysis complete, with microscopic ([facility] only)     Status: Abnormal   Collection Time: 03/27/15  7:25 PM  Result Value Ref Range   Color, Urine AMBER (A) YELLOW   APPearance CLOUDY (A) CLEAR   Glucose, UA NEGATIVE NEGATIVE mg/dL   Bilirubin Urine NEGATIVE NEGATIVE   Ketones, ur 1+ (A) NEGATIVE mg/dL   Specific Gravity, Urine 1.028 1.005 - 1.030   Hgb urine dipstick 2+ (A) NEGATIVE   pH 5.0 5.0 - 8.0   Protein, ur 100 (A) NEGATIVE mg/dL   Nitrite POSITIVE (A) NEGATIVE   Leukocytes, UA 3+ (A) NEGATIVE   RBC / HPF 6-30 0 - 5 RBC/hpf   WBC, UA TOO NUMEROUS TO COUNT 0 - 5 WBC/hpf   Bacteria, UA MANY (A) NONE SEEN   Squamous Epithelial / LPF 6-30 (A) NONE SEEN   WBC Clumps PRESENT    Mucous PRESENT   CBC WITH DIFFERENTIAL     Status: Abnormal   Collection Time: 03/27/15  7:26 PM  Result Value Ref Range   WBC 18.2 (H) 3.6 - 11.0 K/uL   RBC 3.59 (L) 3.80 - 5.20 MIL/uL   Hemoglobin 7.7 (L) 12.0 - 16.0 g/dL  HCT 24.5 (L) 35.0 - 47.0 %   MCV 68.1 (L) 80.0 - 100.0 fL   MCH 21.5 (L) 26.0 - 34.0 pg   MCHC 31.6 (L) 32.0 - 36.0 g/dL   RDW 17.6 (H) 11.5 - 14.5 %   Platelets 303 150 - 440 K/uL   Neutrophils Relative % 88 %   Neutro Abs 16.0 (H) 1.4 - 6.5 K/uL   Lymphocytes Relative 4 %   Lymphs Abs 0.8 (L) 1.0 - 3.6 K/uL   Monocytes Relative 8 %   Monocytes Absolute 1.4 (H) 0.2 - 0.9  K/uL   Eosinophils Relative 0 %   Eosinophils Absolute 0.0 0 - 0.7 K/uL   Basophils Relative 0 %   Basophils Absolute 0.1 0 - 0.1 K/uL  Comprehensive metabolic panel     Status: Abnormal   Collection Time: 03/27/15  7:26 PM  Result Value Ref Range   Sodium 134 (L) 135 - 145 mmol/L   Potassium 3.2 (L) 3.5 - 5.1 mmol/L   Chloride 100 (L) 101 - 111 mmol/L   CO2 23 22 - 32 mmol/L   Glucose, Bld 92 65 - 99 mg/dL   BUN 15 6 - 20 mg/dL   Creatinine, Ser 0.83 0.44 - 1.00 mg/dL   Calcium 8.5 (L) 8.9 - 10.3 mg/dL   Total Protein 7.6 6.5 - 8.1 g/dL   Albumin 3.8 3.5 - 5.0 g/dL   AST 17 15 - 41 U/L   ALT 9 (L) 14 - 54 U/L   Alkaline Phosphatase 83 38 - 126 U/L   Total Bilirubin 0.5 0.3 - 1.2 mg/dL   GFR calc non Af Amer >60 >60 mL/min   GFR calc Af Amer >60 >60 mL/min    Comment: (NOTE) The eGFR has been calculated using the CKD EPI equation. This calculation has not been validated in all clinical situations. eGFR's persistently <60 mL/min signify possible Chronic Kidney Disease.    Anion gap 11 5 - 15  Lipase, blood     Status: Abnormal   Collection Time: 03/27/15  7:26 PM  Result Value Ref Range   Lipase 15 (L) 22 - 51 U/L  Lactic acid, plasma     Status: None   Collection Time: 03/27/15  7:26 PM  Result Value Ref Range   Lactic Acid, Venous 1.3 0.5 - 2.0 mmol/L  hCG, quantitative, pregnancy     Status: None   Collection Time: 03/27/15  7:26 PM  Result Value Ref Range   hCG, Beta Chain, Quant, S <1 <5 mIU/mL    Comment:          GEST. AGE      CONC.  (mIU/mL)   <=1 WEEK        5 - 50     2 WEEKS       50 - 500     3 WEEKS       100 - 10,000     4 WEEKS     1,000 - 30,000     5 WEEKS     3,500 - 115,000   6-8 WEEKS     12,000 - 270,000    12 WEEKS     15,000 - 220,000        FEMALE AND NON-PREGNANT FEMALE:     LESS THAN 5 mIU/mL   Type and screen [place]     Status: None   Collection Time: 03/27/15  9:35 PM  Result Value Ref Range  ABO/RH(D) O  NEG    Antibody Screen NEG    Sample Expiration 03/30/2015   ABO/Rh     Status: None   Collection Time: 03/27/15  9:36 PM  Result Value Ref Range   ABO/RH(D) O NEG   Wet prep, genital     Status: Abnormal   Collection Time: 03/27/15 11:54 PM  Result Value Ref Range   Yeast Wet Prep HPF POC NONE SEEN NONE SEEN   Trich, Wet Prep NONE SEEN NONE SEEN   Clue Cells Wet Prep HPF POC POSITIVE (A) NONE SEEN   WBC, Wet Prep HPF POC MANY (A) NONE SEEN    Ct Abdomen Pelvis W Contrast  03/27/2015  CLINICAL DATA:  Severe lower abdominal pain. Acute anemia and leukocytosis. EXAM: CT ABDOMEN AND PELVIS WITH CONTRAST TECHNIQUE: Multidetector CT imaging of the abdomen and pelvis was performed using the standard protocol following bolus administration of intravenous contrast. CONTRAST:  19m OMNIPAQUE IOHEXOL 300 MG/ML  SOLN COMPARISON:  None. FINDINGS: Lower chest:  The included lung bases are clear. Liver: Normal, no focal lesion. Hepatobiliary: Gallbladder physiologically distended, no calcified stones. No biliary dilatation. Pancreas: Normal. Spleen: Normal. Adrenal glands: No nodule. Kidneys: Symmetric renal enhancement. No hydronephrosis. No perinephric stranding or focal renal abnormality. Stomach/Bowel: Stomach physiologically distended. There are no dilated or thickened small bowel loops. Small volume of stool throughout the colon without colonic wall thickening. The appendix is not confidently identified. Vascular/Lymphatic: No retroperitoneal adenopathy. Abdominal aorta is normal in caliber. Reproductive: Diffuse pelvic soft tissue stranding. Bilateral adnexal irregularly-shaped multiloculated fluid collections. This appears larger on the left and extends posteriorly into the midline and right pelvis measuring at least 10 cm. On the right this measures at least 5 cm. The uterus demonstrates heterogeneous enhancement. Bladder: Physiologically distended, no wall thickening. Other: No free air.  Musculoskeletal: There are no acute or suspicious osseous abnormalities. IMPRESSION: Bilateral multiloculated pelvic/adnexal fluid collections, most concerning for bilateral hydro/pyosalpinx and tubo-ovarian abscess. Electronically Signed   By: MJeb LeveringM.D.   On: 03/27/2015 22:37    Assessment/Plan: Sepsis by SIRS criteria (WBC, HR) due to TOA 1. Sepsis - aggressive IVH - 150cc/hr - strict I/O - broad spectrum antibiotics - monitor vitals - pt hemodynamically stable currently (no further tachycardia) - no need for emergent OR at this time  2. Large TOA - wet prep +clue cells - f/u gc/ct - given the size and the patient's pain, will give 48hrs of IV abx prior to switching to PO - will consult IR tomorrow to place drain if amenable to drainage - doxy 100 mg  IV every 12 hours - cefoxitin 2 g IV every 6 hours - flagyl 500 mg IV every 8 hours for BV  3. Possible UTI - f/u ucx - abx cover most organisms  4. Anemia - hb 7.7, baseline 10 - no h/o chronic bleeding - will send iron studies tomorrow - discussed possible blood transfusion with patient, she is amenable if necessary     JLorette Ang10/20/2016   Addendum:  GC + CT + - will inform patient in the morning  - covered by antibiotic regimen

## 2015-03-28 NOTE — Progress Notes (Signed)
Milon Scorearon Jones CNM paged for request for active Transfusion of Blood orderset, new IV site with NS order for blood transfusion since antibiotics are infusing, change for pt NPO after midnight tonight and clears until then, order change for drug screen instead of send out. Rolan BuccoCaron to enter these orders.

## 2015-03-29 ENCOUNTER — Inpatient Hospital Stay
Admit: 2015-03-29 | Discharge: 2015-03-29 | Disposition: A | Discharge status: Home / Self Care | Payer: Self-pay | Attending: Obstetrics and Gynecology | Admitting: Obstetrics and Gynecology

## 2015-03-29 ENCOUNTER — Inpatient Hospital Stay: Payer: Self-pay

## 2015-03-29 LAB — URINE CULTURE: SPECIAL REQUESTS: NORMAL

## 2015-03-29 LAB — COMPREHENSIVE METABOLIC PANEL
ALK PHOS: 64 U/L (ref 38–126)
ALT: 8 U/L — ABNORMAL LOW (ref 14–54)
AST: 14 U/L — ABNORMAL LOW (ref 15–41)
Albumin: 2.9 g/dL — ABNORMAL LOW (ref 3.5–5.0)
Anion gap: 6 (ref 5–15)
BUN: 7 mg/dL (ref 6–20)
CALCIUM: 7.9 mg/dL — AB (ref 8.9–10.3)
CO2: 25 mmol/L (ref 22–32)
CREATININE: 0.54 mg/dL (ref 0.44–1.00)
Chloride: 108 mmol/L (ref 101–111)
GFR calc non Af Amer: >60 mL/min (ref >60)
Glucose, Bld: 79 mg/dL (ref 65–99)
Potassium: 3.2 mmol/L — ABNORMAL LOW (ref 3.5–5.1)
SODIUM: 139 mmol/L (ref 135–145)
Total Bilirubin: 1.1 mg/dL (ref 0.3–1.2)
Total Protein: 6.4 g/dL — ABNORMAL LOW (ref 6.5–8.1)

## 2015-03-29 LAB — CBC WITH DIFFERENTIAL/PLATELET
Basophils Absolute: 0 10*3/uL (ref 0–0.1)
Basophils Relative: 0 %
EOS ABS: 0.2 10*3/uL (ref 0–0.7)
HCT: 31.7 % — ABNORMAL LOW (ref 35.0–47.0)
HEMOGLOBIN: 10.2 g/dL — AB (ref 12.0–16.0)
LYMPHS ABS: 1.9 10*3/uL (ref 1.0–3.6)
Lymphocytes Relative: 15 %
MCH: 24 pg — AB (ref 26.0–34.0)
MCHC: 32.1 g/dL (ref 32.0–36.0)
MCV: 74.6 fL — ABNORMAL LOW (ref 80.0–100.0)
MONO ABS: 1.4 10*3/uL — AB (ref 0.2–0.9)
Neutro Abs: 9.1 10*3/uL — ABNORMAL HIGH (ref 1.4–6.5)
Neutrophils Relative %: 72 %
PLATELETS: 239 10*3/uL (ref 150–440)
RBC: 4.24 MIL/uL (ref 3.80–5.20)
RDW: 21.5 % — ABNORMAL HIGH (ref 11.5–14.5)
WBC: 12.6 10*3/uL — ABNORMAL HIGH (ref 3.6–11.0)

## 2015-03-29 MED ORDER — MIDAZOLAM HCL 5 MG/5ML IJ SOLN
INTRAMUSCULAR | Status: AC
  Filled 2015-03-29: qty 10

## 2015-03-29 MED ORDER — FENTANYL CITRATE (PF) 100 MCG/2ML IJ SOLN
INTRAMUSCULAR | Status: AC | PRN
  Administered 2015-03-29 (×2): 50 ug via INTRAVENOUS

## 2015-03-29 MED ORDER — MIDAZOLAM HCL 2 MG/2ML IJ SOLN
INTRAMUSCULAR | Status: AC | PRN
  Administered 2015-03-29: 2 mg via INTRAVENOUS
  Administered 2015-03-29: 1 mg via INTRAVENOUS

## 2015-03-29 MED ORDER — DEXTROSE 5 % IV SOLN
2.0000 g | Freq: Four times a day (QID) | INTRAVENOUS | Status: DC
  Administered 2015-03-29 – 2015-03-30 (×5): 2 g via INTRAVENOUS
  Filled 2015-03-29 (×11): qty 2

## 2015-03-29 MED ORDER — FENTANYL CITRATE (PF) 100 MCG/2ML IJ SOLN
INTRAMUSCULAR | Status: AC
  Filled 2015-03-29: qty 4

## 2015-03-29 MED ORDER — OXYCODONE-ACETAMINOPHEN 5-325 MG PO TABS
1.0000 | ORAL_TABLET | Freq: Four times a day (QID) | ORAL | Status: DC | PRN
  Administered 2015-03-29 – 2015-03-31 (×6): 2 via ORAL
  Administered 2015-03-31: 1 via ORAL
  Administered 2015-03-31: 2 via ORAL
  Administered 2015-04-01: 1 via ORAL
  Filled 2015-03-29: qty 1
  Filled 2015-03-29 (×5): qty 2
  Filled 2015-03-29: qty 1
  Filled 2015-03-29 (×2): qty 2

## 2015-03-29 MED ORDER — DEXTROSE-NACL 5-0.45 % IV SOLN
INTRAVENOUS | Status: DC
  Administered 2015-03-29 (×2): via INTRAVENOUS

## 2015-03-29 MED ORDER — POTASSIUM CHLORIDE IN NACL 40-0.9 MEQ/L-% IV SOLN
INTRAVENOUS | Status: DC
  Administered 2015-03-29 (×2): 125 mL/h via INTRAVENOUS
  Filled 2015-03-29 (×5): qty 1000

## 2015-03-29 NOTE — Progress Notes (Signed)
Patient brought to room by radiology nurse. Patient complaining of "numbness" in right lower extremity. Radiology nurse reported symptom to radiologist. Radiologist not concerned.

## 2015-03-29 NOTE — Progress Notes (Signed)
New order from Dr. Tildon HuskyHalfon to discontinue the Dextrose with 1/2 NS. MD also stated the RN should do blood culture in case patient spike any fever. Oxycodone  2 tabs PRN administered with c/o right lower quadrant pain.  No acute distress noted. Will continue to monitor.

## 2015-03-29 NOTE — Progress Notes (Addendum)
PAD#2/TBO S: Feeling some better but, still having abd pain.  O: Gen: 24 yo white female in NAD. A,A & O x 3. T 98.5 P87  R20   BP 108/65   Heart: S1S2, RRR, no M/R/g. Lungs: CTA bilat, No W/R/R. Abd: TTP over the entire abd with RUQ > LUQ. + BS, slight distended. Extrems: [employer]. Labs: Fe is 6, Hgb now 10.2 after 3 units of packed cells A: TBO approx 10 cms on CT 2. +GC, +chlamydia 3. Anemia 4. Fever P: Continue Doxy, Cefoxitin and Flagyl. 2. Pt has saline lock and could not tolerate Potassium IV. 3. Will hold Potassium (3.2 yest and today) till after procedure this am and then go to po if able. 4. Pt does not want another IV site. Saline lock is in a hand vein. 5. Interventional radiology procedure to be done. Consent needed but, unsure of the radiologist and the name of the procedure so will get consents when called for the procedure. 6. [place]ine to monitor pt status and VS.

## 2015-03-29 NOTE — Procedures (Signed)
Interventional Radiology Procedure Note  Procedure:  CT guided drainage of TOA  Complications:  None  Estimated Blood Loss:  < 10 mL  18 G trocar needle advanced from right transgluteal approach.  3 adjacent pockets decompressed in pelvis yielding total of 35 mL of grossly purulent fluid. Sample sent for culture. CT shows decompression of pockets.  No need for drain placement today.  No window to drain right lateral pocket safely.  Jodi MarbleGlenn T. Fredia SorrowYamagata, M.D Pager:  [phone]

## 2015-03-29 NOTE — Progress Notes (Signed)
PICC line placed and X- ray needed to verify placement. Lab wanted to know HCG results before they do the X-ray. Dr. Tildon HuskyHalfon notified with an order for the Chest X- ray and HCG was found to be negative. Patient is alert and oriented. No acute distress noted.

## 2015-03-30 LAB — COMPREHENSIVE METABOLIC PANEL
ALBUMIN: 2.8 g/dL — AB (ref 3.5–5.0)
ALK PHOS: 58 U/L (ref 38–126)
ALT: 7 U/L — AB (ref 14–54)
AST: 13 U/L — AB (ref 15–41)
Anion gap: 5 (ref 5–15)
BILIRUBIN TOTAL: 0.3 mg/dL (ref 0.3–1.2)
CALCIUM: 7.9 mg/dL — AB (ref 8.9–10.3)
CO2: 25 mmol/L (ref 22–32)
CREATININE: 0.54 mg/dL (ref 0.44–1.00)
Chloride: 106 mmol/L (ref 101–111)
GFR calc Af Amer: >60 mL/min (ref >60)
GFR calc non Af Amer: >60 mL/min (ref >60)
GLUCOSE: 91 mg/dL (ref 65–99)
Potassium: 3.4 mmol/L — ABNORMAL LOW (ref 3.5–5.1)
Sodium: 136 mmol/L (ref 135–145)
TOTAL PROTEIN: 5.7 g/dL — AB (ref 6.5–8.1)

## 2015-03-30 LAB — TYPE AND SCREEN
ABO/RH(D): O NEG
Antibody Screen: NEGATIVE
UNIT DIVISION: 0
UNIT DIVISION: 0
Unit division: 0

## 2015-03-30 LAB — CBC
HEMATOCRIT: 29.1 % — AB (ref 35.0–47.0)
HEMOGLOBIN: 9.5 g/dL — AB (ref 12.0–16.0)
MCH: 24.4 pg — ABNORMAL LOW (ref 26.0–34.0)
MCHC: 32.6 g/dL (ref 32.0–36.0)
MCV: 75 fL — ABNORMAL LOW (ref 80.0–100.0)
Platelets: 233 10*3/uL (ref 150–440)
RBC: 3.89 MIL/uL (ref 3.80–5.20)
RDW: 21.1 % — ABNORMAL HIGH (ref 11.5–14.5)
WBC: 10.9 10*3/uL (ref 3.6–11.0)

## 2015-03-30 MED ORDER — ONDANSETRON HCL 4 MG PO TABS: 4.0000 mg | ORAL_TABLET | Freq: Three times a day (TID) | ORAL | Status: DC | PRN

## 2015-03-30 MED ORDER — METRONIDAZOLE 500 MG PO TABS
500.0000 mg | ORAL_TABLET | Freq: Two times a day (BID) | ORAL | Status: DC
Start: 2015-03-30 — End: 2015-04-01
  Administered 2015-03-31 – 2015-04-01 (×4): 500 mg via ORAL
  Filled 2015-03-30 (×4): qty 1

## 2015-03-30 MED ORDER — FAMOTIDINE 20 MG PO TABS
40.0000 mg | ORAL_TABLET | Freq: Every day | ORAL | Status: DC
  Administered 2015-03-31 – 2015-04-01 (×2): 40 mg via ORAL
  Filled 2015-03-30 (×2): qty 2

## 2015-03-30 MED ORDER — LEVOFLOXACIN 500 MG PO TABS
500.0000 mg | ORAL_TABLET | Freq: Every day | ORAL | Status: DC
Start: 2015-03-31 — End: 2015-04-01
  Administered 2015-03-31 – 2015-04-01 (×2): 500 mg via ORAL
  Filled 2015-03-30 (×3): qty 1

## 2015-03-30 MED ORDER — HEPARIN SOD (PORK) LOCK FLUSH 10 UNIT/ML IV SOLN
10.0000 [IU] | Freq: Three times a day (TID) | INTRAVENOUS | Status: DC | PRN
  Administered 2015-03-30 (×2): 10 [IU]
  Filled 2015-03-30 (×5): qty 1

## 2015-03-30 MED ORDER — DOCUSATE SODIUM 100 MG PO CAPS
100.0000 mg | ORAL_CAPSULE | Freq: Two times a day (BID) | ORAL | Status: DC
Start: 2015-03-30 — End: 2015-04-01
  Administered 2015-03-31 – 2015-04-01 (×3): 100 mg via ORAL
  Filled 2015-03-30 (×3): qty 1

## 2015-03-30 NOTE — Progress Notes (Signed)
OB ATTENDING NOTE *late entry due to clinical volume*  HD#3  Overnight events: afebrile, tolerating PO, d/c'd IVH  S: Patient doing well today. No f/c. Pain controlled with PO percocet. Ambulating without difficulty. No other complaints.  Ceasar Mons: Filed Vitals:   03/30/15 0734 03/30/15 1100 03/30/15 1632 03/30/15 2015  BP: 96/66 108/70 97/60 110/75  Pulse: 63 60 60 72  Temp: 97.8 F (36.6 C) 98.5 F (36.9 C) 98.3 F (36.8 C)   TempSrc: Oral Oral Oral   Resp: 16 18 18    Height:      Weight:      SpO2: 98%   100%    General appearance: alert, cooperative and appears stated age Lungs: clear to auscultation bilaterally Heart: regular rate and rhythm, S1, S2 normal, no murmur, click, rub or gallop Abdomen: soft, minimally TTP in lower abdomen, no rebound/guarding, still moderately distended Extremities: extremities normal, atraumatic, no cyanosis or edema Skin: Skin color, texture, turgor normal. No rashes or lesions   Labs: CBC Latest Ref Rng 03/30/2015 03/29/2015 03/28/2015  WBC 3.6 - 11.0 K/uL 10.9 12.6(H) 14.7(H)  Hemoglobin 12.0 - 16.0 g/dL 2.9(F9.5(L) 10.2(L) 6.5(L)  Hematocrit 35.0 - 47.0 % 29.1(L) 31.7(L) 21.0(L)  Platelets 150 - 440 K/uL 233 239 235   CMP Latest Ref Rng 03/30/2015 03/29/2015 03/27/2015  Glucose 65 - 99 mg/dL 91 79 92  BUN 6 - 20 mg/dL <6(O<5(L) 7 15  Creatinine 0.44 - 1.00 mg/dL 1.300.54 8.650.54 7.840.83  Sodium 135 - 145 mmol/L 136 139 134(L)  Potassium 3.5 - 5.1 mmol/L 3.4(L) 3.2(L) 3.2(L)  Chloride 101 - 111 mmol/L 106 108 100(L)  CO2 22 - 32 mmol/L 25 25 23   Calcium 8.9 - 10.3 mg/dL 7.9(L) 7.9(L) 8.5(L)  Total Protein 6.5 - 8.1 g/dL 6.9(G5.7(L) 6.4(L) 7.6  Total Bilirubin 0.3 - 1.2 mg/dL 0.3 1.1 0.5  Alkaline Phos 38 - 126 U/L 58 64 83  AST 15 - 41 U/L 13(L) 14(L) 17  ALT 14 - 54 U/L 7(L) 8(L) 9(L)   A/P: 24 y.o. G2P2 female with 10cm and 5cm b/l TOAs, initially admitted with sepsis. HD#3, now almost 48hours afebrile on IV abx regimen and POD#1 s/p IR drainage of 35cc  of purulent fluid 10/21. Patient continues to improve. Aware of the long recovery course for her pelvic pain.   1. TOA - Significant improvement on IV regimen and after IR drainage - Will transition to PO meds tonight and watch in house for 24 hours - If tolerating PO regimen and afebrile, will d/c Monday on oral antibiotics with plan to complete 10 days of outpatient oral regimen (levo 500mg  daily and flagyl 500mg  BID).  - Will plan to see patient in 10 days in office with CTABP to determine if prolonging the course of antibiotics is necessary   - Will dc with 20 percocets and colace for pelvic pain from TOA  2. GC + CT + - will need TOC for chlamydia and gonorrhea in 3 months - condom use encouraged - partner treatment encouraged  3. Klebsiella UTI - TOC at office visit  4. Severe Iron deficiency Anemia, uncertain etiology - Iron level 6, saturation ratio 2 - s/p 3U PRBC transfusion for hb 6.5 with appropriate rise - will d/c on iron supplementation - suspicious that poor diet contributing to iron deficiency  Anticipate d/c on HD#5  Ala DachJohanna K Halfon, MD

## 2015-03-31 LAB — CBC WITH DIFFERENTIAL/PLATELET
BASOS PCT: 0 %
Basophils Absolute: 0 10*3/uL (ref 0–0.1)
EOS ABS: 0.5 10*3/uL (ref 0–0.7)
Eosinophils Relative: 5 %
HEMATOCRIT: 31.2 % — AB (ref 35.0–47.0)
HEMOGLOBIN: 10 g/dL — AB (ref 12.0–16.0)
Lymphocytes Relative: 16 %
Lymphs Abs: 1.5 10*3/uL (ref 1.0–3.6)
MCH: 24.1 pg — ABNORMAL LOW (ref 26.0–34.0)
MCHC: 32.1 g/dL (ref 32.0–36.0)
MCV: 75.1 fL — ABNORMAL LOW (ref 80.0–100.0)
MONOS PCT: 10 %
Monocytes Absolute: 0.9 10*3/uL (ref 0.2–0.9)
NEUTROS ABS: 6.7 10*3/uL — AB (ref 1.4–6.5)
NEUTROS PCT: 69 %
Platelets: 246 10*3/uL (ref 150–440)
RBC: 4.15 MIL/uL (ref 3.80–5.20)
RDW: 21.6 % — ABNORMAL HIGH (ref 11.5–14.5)
WBC: 9.7 10*3/uL (ref 3.6–11.0)

## 2015-03-31 LAB — COMPREHENSIVE METABOLIC PANEL
ALBUMIN: 2.9 g/dL — AB (ref 3.5–5.0)
ALK PHOS: 56 U/L (ref 38–126)
ALT: 7 U/L — AB (ref 14–54)
AST: 11 U/L — AB (ref 15–41)
Anion gap: 5 (ref 5–15)
BILIRUBIN TOTAL: 0.2 mg/dL — AB (ref 0.3–1.2)
CALCIUM: 8.4 mg/dL — AB (ref 8.9–10.3)
CO2: 29 mmol/L (ref 22–32)
CREATININE: 0.6 mg/dL (ref 0.44–1.00)
Chloride: 108 mmol/L (ref 101–111)
GFR calc Af Amer: >60 mL/min (ref >60)
GFR calc non Af Amer: >60 mL/min (ref >60)
GLUCOSE: 97 mg/dL (ref 65–99)
Potassium: 3.7 mmol/L (ref 3.5–5.1)
SODIUM: 142 mmol/L (ref 135–145)
TOTAL PROTEIN: 6 g/dL — AB (ref 6.5–8.1)

## 2015-03-31 MED ORDER — OXYCODONE-ACETAMINOPHEN 5-325 MG PO TABS: 1.0000 | ORAL_TABLET | Freq: Four times a day (QID) | ORAL | Status: AC | PRN

## 2015-03-31 MED ORDER — INFLUENZA VAC SPLIT QUAD 0.5 ML IM SUSY: 0.5000 mL | PREFILLED_SYRINGE | INTRAMUSCULAR | Status: DC

## 2015-03-31 MED ORDER — METRONIDAZOLE 500 MG PO TABS: 500.0000 mg | ORAL_TABLET | Freq: Two times a day (BID) | ORAL | Status: AC

## 2015-03-31 MED ORDER — DOCUSATE SODIUM 100 MG PO CAPS: 100.0000 mg | ORAL_CAPSULE | Freq: Two times a day (BID) | ORAL | Status: AC

## 2015-03-31 MED ORDER — LEVOFLOXACIN 500 MG PO TABS: 500.0000 mg | ORAL_TABLET | Freq: Every day | ORAL | Status: AC

## 2015-03-31 MED ORDER — FERROUS SULFATE 325 (65 FE) MG PO TABS: 325.0000 mg | ORAL_TABLET | Freq: Two times a day (BID) | ORAL | Status: AC

## 2015-03-31 NOTE — Discharge Instructions (Signed)
Pelvic Inflammatory Disease °Pelvic inflammatory disease (PID) refers to an infection in some or all of the female organs. The infection can be in the uterus, ovaries, fallopian tubes, or the surrounding tissues in the pelvis. PID can cause abdominal or pelvic pain that comes on suddenly (acute pelvic pain). PID is a serious infection because it can lead to lasting (chronic) pelvic pain or the inability to have children (infertility). °CAUSES °This condition is most often caused by an infection that is spread during sexual contact. However, the infection can also be caused by the normal bacteria that are found in the vaginal tissues if these bacteria travel upward into the reproductive organs. PID can also occur following: °· The birth of a baby. °· A miscarriage. °· An abortion. °· Major pelvic surgery. °· The use of an intrauterine device (IUD). °· A sexual assault. °RISK FACTORS °This condition is more likely to develop in women who: °· Are younger than 23 years of age. °· Are sexually active at a young age. °· Use nonbarrier contraception. °· Have multiple sexual partners. °· Have sex with someone who has symptoms of an STD (sexually transmitted disease). °· Use oral contraception. °At times, certain behaviors can also increase the possibility of getting PID, such as: °· Using a vaginal douche. °· Having an IUD in place. °SYMPTOMS °Symptoms of this condition include: °· Abdominal or pelvic pain. °· Fever. °· Chills. °· Abnormal vaginal discharge. °· Abnormal uterine bleeding. °· Unusual pain shortly after the end of a menstrual period. °· Painful urination. °· Pain with sexual intercourse. °· Nausea and vomiting. °DIAGNOSIS °To diagnose this condition, your health care provider will do a physical exam and take your medical history. A pelvic exam typically reveals great tenderness in the uterus and the surrounding pelvic tissues. You may also have tests, such as: °· Lab tests, including a pregnancy test, blood  tests, and urine test. °· Culture tests of the vagina and cervix to check for an STD. °· Ultrasound. °· A laparoscopic procedure to look inside the pelvis. °· Examining vaginal secretions under a microscope. °TREATMENT °Treatment for this condition may involve one or more approaches. °· Antibiotic medicines may be prescribed to be taken by mouth. °· Sexual partners may need to be treated if the infection is caused by an STD. °· For more severe cases, hospitalization may be needed to give antibiotics directly into a vein through an IV tube. °· Surgery may be needed if other treatments do not help, but this is rare. °It may take weeks until you are completely well. If you are diagnosed with PID, you should also be checked for human immunodeficiency virus (HIV). Your health care provider may test you for infection again 3 months after treatment. You should not have unprotected sex. °HOME CARE INSTRUCTIONS °· Take over-the-counter and prescription medicines only as told by your health care provider. °· If you were prescribed an antibiotic medicine, take it as told by your health care provider. Do not stop taking the antibiotic even if you start to feel better. °· Do not have sexual intercourse until treatment is completed or as told by your health care provider. If PID is confirmed, your recent sexual partners will need treatment, especially if you had unprotected sex. °· Keep all follow-up visits as told by your health care provider. This is important. °SEEK MEDICAL CARE IF: °· You have increased or abnormal vaginal discharge. °· Your pain does not improve. °· You vomit. °· You have a fever. °· You   cannot tolerate your medicines.  Your partner has an STD.  You have pain when you urinate. SEEK IMMEDIATE MEDICAL CARE IF:  You have increased abdominal or pelvic pain.  You have chills.  Your symptoms are not better in 72 hours even with treatment.   This information is not intended to replace advice given to  you by your health care provider. Make sure you discuss any questions you have with your health care provider.   Document Released: 05/25/2005 Document Revised: 02/13/2015 Document Reviewed: 07/02/2014 Elsevier Interactive Patient Education 2016 [employer].  Anemia, Nonspecific Anemia is a condition in which the concentration of red blood cells or hemoglobin in the blood is below normal. Hemoglobin is a substance in red blood cells that carries oxygen to the tissues of the body. Anemia results in not enough oxygen reaching these tissues.  CAUSES  Common causes of anemia include:   Excessive bleeding. Bleeding may be internal or external. This includes excessive bleeding from periods (in women) or from the intestine.   Poor nutrition.   Chronic kidney, thyroid, and liver disease.  Bone marrow disorders that decrease red blood cell production.  Cancer and treatments for cancer.  HIV, AIDS, and their treatments.  Spleen problems that increase red blood cell destruction.  Blood disorders.  Excess destruction of red blood cells due to infection, medicines, and autoimmune disorders. SIGNS AND SYMPTOMS   Minor weakness.   Dizziness.   Headache.  Palpitations.   Shortness of breath, especially with exercise.   Paleness.  Cold sensitivity.  Indigestion.  Nausea.  Difficulty sleeping.  Difficulty concentrating. Symptoms may occur suddenly or they may develop slowly.  DIAGNOSIS  Additional blood tests are often needed. These help your health care provider determine the best treatment. Your health care provider will check your stool for blood and look for other causes of blood loss.  TREATMENT  Treatment varies depending on the cause of the anemia. Treatment can include:   Supplements of iron, vitamin B12, or folic acid.   Hormone medicines.   A blood transfusion. This may be needed if blood loss is severe.   Hospitalization. This may be needed if there  is significant continual blood loss.   Dietary changes.  Spleen removal. HOME CARE INSTRUCTIONS Keep all follow-up appointments. It often takes many weeks to correct anemia, and having your health care provider check on your condition and your response to treatment is very important. SEEK IMMEDIATE MEDICAL CARE IF:   You develop extreme weakness, shortness of breath, or chest pain.   You become dizzy or have trouble concentrating.  You develop heavy vaginal bleeding.   You develop a rash.   You have bloody or black, tarry stools.   You faint.   You vomit up blood.   You vomit repeatedly.   You have abdominal pain.  You have a fever or persistent symptoms for more than 2-3 days.   You have a fever and your symptoms suddenly get worse.   You are dehydrated.  MAKE SURE YOU:  Understand these instructions.  Will watch your condition.  Will get help right away if you are not doing well or get worse.   This information is not intended to replace advice given to you by your health care provider. Make sure you discuss any questions you have with your health care provider.   Document Released: 07/02/2004 Document Revised: 01/25/2013 Document Reviewed: 11/18/2012 Elsevier Interactive Patient Education 2016 Elsevier Inc.   Antibiotic Medicine Antibiotic medicines are  used to treat infections caused by bacteria. They work by hurting or killing the germs that are making you sick. HOW WILL MY MEDICINE BE PICKED? There are many kinds of antibiotic medicines. To help your doctor pick one, tell your doctor if:  You have any allergies.  You are pregnant or plan to get pregnant.  You are breastfeeding.  You are taking any medicines. These include over-the-counter medicines, prescription medicines, and herbal remedies.  You have a medical condition or problem. If you have questions about why your medicine was picked, ask. FOR HOW LONG SHOULD I TAKE MY  MEDICINE? Take your medicine for as long as your doctor tells you to. Do not stop taking it when you feel better. If you stop taking it too soon:  You may start to feel sick again.  Your infection may get harder to treat.  New problems may develop. WHAT IF I MISS A DOSE? Try not to miss any doses of antibiotic medicine. If you miss a dose:  Take the dose as soon as you can.  If you are taking 2 doses a day, take the next dose in 5 to 6 hours.  If you are taking 3 or more doses a day, take the next dose in 2 to 4 hours. Then go back to the normal schedule. If you cannot take a missed dose, take the next dose on time. Then take the missed dose after you have taken all the doses as told by your doctor, as if you had one more dose left. DOES THIS MEDICINE AFFECT BIRTH CONTROL? Birth control pills may not work while you are on antibiotic medicines. If you are taking birth control pills, keep taking them as usual. Use a second form of birth control, such as a condom. Keep using the second form of birth control until you are finished with your current 1 month cycle of birth control pills. GET HELP IF:  You get worse.  You do not feel better a few days after starting the medicine.  You throw up (vomit).  There are white patches in your mouth.  You have new joint pain after starting the medicine.  You have new muscle aches after starting the medicine.  You had a fever before starting the medicine, and it comes back.  You have any symptoms of an allergic reaction, such as an itchy rash. If this happens, stop taking the medicine. GET HELP RIGHT AWAY IF:  Your pee (urine) turns dark or becomes blood-colored.  Your skin turns yellow.  You bruise or bleed easily.  You have very bad watery poop (diarrhea) and cramps in your belly (abdomen).  You have a very bad headache.  You have signs of a very bad allergic reaction, such as:  Trouble breathing.  Wheezing.  Swelling of the  lips, tongue, or face.  Fainting.  Blisters on the skin or in the mouth. If you have signs of a very bad allergic reaction, stop taking the antibiotic medicine right away.   This information is not intended to replace advice given to you by your health care provider. Make sure you discuss any questions you have with your health care provider.   Document Released: 03/03/2008 Document Revised: 02/13/2015 Document Reviewed: 10/10/2014 Elsevier Interactive Patient Education [employer]2016 Elsevier Inc.

## 2015-03-31 NOTE — Progress Notes (Signed)
OB ATTENDING NOTE  HD#4  Overnight events: afebrile, tolerating PO, d/c'd IV medications  S: Patient doing well today. No f/c. Pain controlled with PO percocet. Ambulating without difficulty. No other complaints.  Ceasar Mons: Filed Vitals:   03/30/15 2015 03/31/15 0017 03/31/15 0330 03/31/15 0753  BP: 110/75 118/76 110/71 104/64  Pulse: 72 57 57 77  Temp: 98.5 F (36.9 C) 98.4 F (36.9 C) 98.5 F (36.9 C) 98.1 F (36.7 C)  TempSrc: Oral Oral Oral Oral  Resp: 18  18 18   Height:      Weight:      SpO2: 100% 99% 99% 99%    General appearance: alert, cooperative and appears stated age Lungs: clear to auscultation bilaterally Heart: regular rate and rhythm, S1, S2 normal, no murmur, click, rub or gallop Abdomen: soft, non-distended, min TTP on palpation, no rebound/guarding, much improved Extremities: extremities normal, atraumatic, no cyanosis or edema Skin: Skin color, texture, turgor normal. No rashes or lesions   Labs: CBC Latest Ref Rng 03/31/2015 03/30/2015 03/29/2015  WBC 3.6 - 11.0 K/uL 9.7 10.9 12.6(H)  Hemoglobin 12.0 - 16.0 g/dL 10.0(L) 9.5(L) 10.2(L)  Hematocrit 35.0 - 47.0 % 31.2(L) 29.1(L) 31.7(L)  Platelets 150 - 440 K/uL 246 233 239   CMP Latest Ref Rng 03/31/2015 03/30/2015 03/29/2015  Glucose 65 - 99 mg/dL 97 91 79  BUN 6 - 20 mg/dL <2(Z<5(L) <3(Y<5(L) 7  Creatinine 0.44 - 1.00 mg/dL 8.650.60 7.840.54 6.960.54  Sodium 135 - 145 mmol/L 142 136 139  Potassium 3.5 - 5.1 mmol/L 3.7 3.4(L) 3.2(L)  Chloride 101 - 111 mmol/L 108 106 108  CO2 22 - 32 mmol/L 29 25 25   Calcium 8.9 - 10.3 mg/dL 2.9(B8.4(L) 7.9(L) 7.9(L)  Total Protein 6.5 - 8.1 g/dL 6.0(L) 5.7(L) 6.4(L)  Total Bilirubin 0.3 - 1.2 mg/dL 2.8(U0.2(L) 0.3 1.1  Alkaline Phos 38 - 126 U/L 56 58 64  AST 15 - 41 U/L 11(L) 13(L) 14(L)  ALT 14 - 54 U/L 7(L) 7(L) 8(L)   Microbiology: Results for orders placed or performed during the hospital encounter of 03/27/15  Urine culture     Status: None   Collection Time: 03/27/15  7:25 PM  Result  Value Ref Range Status   Specimen Description URINE, CLEAN CATCH  Final   Special Requests Normal  Final   Culture >=100,000 COLONIES/mL KLEBSIELLA PNEUMONIAE  Final   Report Status 03/29/2015 FINAL  Final   Organism ID, Bacteria KLEBSIELLA PNEUMONIAE  Final      Susceptibility   Klebsiella pneumoniae - MIC*    AMPICILLIN 16 RESISTANT Resistant     CEFAZOLIN <=4 SENSITIVE Sensitive     CEFTRIAXONE <=1 SENSITIVE Sensitive     CIPROFLOXACIN <=0.25 SENSITIVE Sensitive     GENTAMICIN <=1 SENSITIVE Sensitive     IMIPENEM <=0.25 SENSITIVE Sensitive     NITROFURANTOIN 64 INTERMEDIATE Intermediate     TRIMETH/SULFA <=20 SENSITIVE Sensitive     PIP/TAZO Value in next row Sensitive      SENSITIVE<=4    LEVOFLOXACIN Value in next row Sensitive      SENSITIVE<=0.12    * >=100,000 COLONIES/mL KLEBSIELLA PNEUMONIAE  Chlamydia/NGC rt PCR ([facility] only)     Status: Abnormal   Collection Time: 03/27/15 11:54 PM  Result Value Ref Range Status   Specimen source GC/Chlam ENDOCERVICAL  Final   Chlamydia Tr DETECTED (A) NOT DETECTED Final   N gonorrhoeae DETECTED (A) NOT DETECTED Final    Comment: (NOTE) 100  This methodology has not been evaluated  in pregnant women or in 200  patients with a history of hysterectomy. 300 400  This methodology will not be performed on patients less than 38  years of age.   Wet prep, genital     Status: Abnormal   Collection Time: 03/27/15 11:54 PM  Result Value Ref Range Status   Yeast Wet Prep HPF POC NONE SEEN NONE SEEN Final   Trich, Wet Prep NONE SEEN NONE SEEN Final   Clue Cells Wet Prep HPF POC POSITIVE (A) NONE SEEN Final   WBC, Wet Prep HPF POC MANY (A) NONE SEEN Final  Culture, blood (routine x 2)     Status: None (Preliminary result)   Collection Time: 03/28/15  2:50 PM  Result Value Ref Range Status   Specimen Description BLOOD LEFT HAND  Final   Special Requests   Final    BOTTLES DRAWN AEROBIC AND ANAEROBIC 4CC AEROBIC,2CC ANAEROBIC   Culture NO  GROWTH 3 DAYS  Final   Report Status PENDING  Incomplete  Culture, blood (routine x 2)     Status: None (Preliminary result)   Collection Time: 03/28/15  2:50 PM  Result Value Ref Range Status   Specimen Description BLOOD LEFT ASSIST CONTROL  Final   Special Requests   Final    BOTTLES DRAWN AEROBIC AND ANAEROBIC 3CC AEROBIC,2CC ANAEROBIC   Culture NO GROWTH 3 DAYS  Final   Report Status PENDING  Incomplete   Imaging: CTABP 10/19 - Diffuse pelvic soft tissue stranding. Bilateral adnexal irregularly-shaped multiloculated fluid collections. This appears larger on the left and extends posteriorly into the midline and right pelvis measuring at least 10 cm. On the right this measures at least 5 cm. The uterus demonstrates heterogeneous enhancement.  A/P: 24 y.o. G2P2 female with 10cm and 5cm b/l TOAs, initially admitted with sepsis. HD#4, transitioned to PO abx regimen overnight; also POD#2 s/p IR drainage of 35cc of purulent fluid. Patient continues to improve. Aware of the long recovery course for her pelvic pain. Plan for monitoring on oral abx today and d/c tomorrow for completion of a 10 day oral abx course.  1. TOA - Significant improvement on IV regimen and after IR drainage - (levo  daily and flagyl  BID) for 10 days  - Will plan to see patient in 10 days in office and assess if a CTABP is needed - Will dc with 20 percocets and colace for pelvic pain from TOA  2. GC + CT + - will need TOC for chlamydia and gonorrhea in 3 months - condom use encouraged at all times, but pelvic rest recommended for the next 2 weeks - partner treatment encouraged  3. Klebsiella UTI - TOC at office visit  4. Severe Iron deficiency Anemia, uncertain etiology - Iron level 6, saturation ratio 2 - s/p 3U PRBC transfusion for hb 6.5 with appropriate rise - will d/c on iron supplementation - suspicious that poor diet contributing to iron deficiency  5. LINES - d/c PICC tomorrow  morning  Anticipate d/c tomorrow  Ala Dach, MD

## 2015-03-31 NOTE — Progress Notes (Signed)
Pt complained of left arm swelling and redness. On assessment there was swelling and slight redness below transparent tape. Dr. Tildon HuskyHalfon notified. Received order to discontinue PICC line. PICC line discontinued by Dorathy DaftMarsha Hatch, RN and assisted by Cherly HensenJasmine Lamb, RN from [facility]. Tip intact per 2C nurses.

## 2015-04-01 LAB — CULTURE, ROUTINE-ABSCESS
Culture: NO GROWTH
Special Requests: NORMAL

## 2015-04-01 MED ORDER — IBUPROFEN 200 MG PO TABS
200.0000 mg | ORAL_TABLET | Freq: Four times a day (QID) | ORAL | Status: DC | PRN
  Filled 2015-04-01: qty 1

## 2015-04-01 NOTE — Discharge Summary (Signed)
Obstetric and Gynecology  Subjective  [name] is a 24 y.o. female G2P2002 who presented on 03/27/2015 for abd pain.      Objective   Filed Vitals:   04/01/15 0813  BP: 113/78  Pulse: 56  Temp: 98.2 F (36.8 C)  Resp: 18     Intake/Output Summary (Last 24 hours) at 04/01/15 0913 Last data filed at 03/31/15 2100  Gross per 24 hour  Intake      0 ml  Output   1400 ml  Net  -1400 ml    General: NAD, sleeping but, arouses.  Cardiovascular: RRR, no murmurs. S1S2, No R/G. Pulmonary: CTAB, No W/R/R. Abdomen: Benign. Non-tender, +BS, no guarding. Extremities: No erythema or cords, no calf tenderness, +warmth with normal peripheral pulses.  Labs: No results found for this or any previous visit (from the past 24 hour(s)).  Cultures: Results for orders placed or performed during the hospital encounter of 03/27/15  Urine culture     Status: None   Collection Time: 03/27/15  7:25 PM  Result Value Ref Range Status   Specimen Description URINE, CLEAN CATCH  Final   Special Requests Normal  Final   Culture >=100,000 COLONIES/mL KLEBSIELLA PNEUMONIAE  Final   Report Status 03/29/2015 FINAL  Final   Organism ID, Bacteria KLEBSIELLA PNEUMONIAE  Final      Susceptibility   Klebsiella pneumoniae - MIC*    AMPICILLIN 16 RESISTANT Resistant     CEFAZOLIN <=4 SENSITIVE Sensitive     CEFTRIAXONE <=1 SENSITIVE Sensitive     CIPROFLOXACIN <=0.25 SENSITIVE Sensitive     GENTAMICIN <=1 SENSITIVE Sensitive     IMIPENEM <=0.25 SENSITIVE Sensitive     NITROFURANTOIN 64 INTERMEDIATE Intermediate     TRIMETH/SULFA <=20 SENSITIVE Sensitive     PIP/TAZO Value in next row Sensitive      SENSITIVE<=4    LEVOFLOXACIN Value in next row Sensitive      SENSITIVE<=0.12    * >=100,000 COLONIES/mL KLEBSIELLA PNEUMONIAE  Chlamydia/NGC rt PCR ([facility] only)     Status: Abnormal   Collection Time: 03/27/15 11:54 PM  Result Value Ref Range Status   Specimen source GC/Chlam ENDOCERVICAL  Final   Chlamydia Tr DETECTED (A) NOT DETECTED Final   N gonorrhoeae DETECTED (A) NOT DETECTED Final    Comment: (NOTE) 100  This methodology has not been evaluated in pregnant women or in 200  patients with a history of hysterectomy. 300 400  This methodology will not be performed on patients less than 94  years of age.   Wet prep, genital     Status: Abnormal   Collection Time: 03/27/15 11:54 PM  Result Value Ref Range Status   Yeast Wet Prep HPF POC NONE SEEN NONE SEEN Final   Trich, Wet Prep NONE SEEN NONE SEEN Final   Clue Cells Wet Prep HPF POC POSITIVE (A) NONE SEEN Final   WBC, Wet Prep HPF POC MANY (A) NONE SEEN Final  Culture, blood (routine x 2)     Status: None (Preliminary result)   Collection Time: 03/28/15  2:50 PM  Result Value Ref Range Status   Specimen Description BLOOD LEFT HAND  Final   Special Requests   Final    BOTTLES DRAWN AEROBIC AND ANAEROBIC 4CC AEROBIC,2CC ANAEROBIC   Culture NO GROWTH 3 DAYS  Final   Report Status PENDING  Incomplete  Culture, blood (routine x 2)     Status: None (Preliminary result)   Collection Time: 03/28/15  2:50  PM  Result Value Ref Range Status   Specimen Description BLOOD LEFT ASSIST CONTROL  Final   Special Requests   Final    BOTTLES DRAWN AEROBIC AND ANAEROBIC 3CC AEROBIC,2CC ANAEROBIC   Culture NO GROWTH 3 DAYS  Final   Report Status PENDING  Incomplete    Imaging: Dg Chest 1 View  03/29/2015  CLINICAL DATA:  PICC line placement EXAM: CHEST 1 VIEW COMPARISON:  None. FINDINGS: Lungs are clear.  No pleural effusion or pneumothorax. The heart is normal in size. Right arm PICC terminates at the cavoatrial junction. IMPRESSION: Right arm PICC terminates at the cavoatrial junction. Electronically Signed   By: Charline Bills M.D.   On: 03/29/2015 21:03   Ct Abdomen Pelvis W Contrast  03/27/2015  CLINICAL DATA:  Severe lower abdominal pain. Acute anemia and leukocytosis. EXAM: CT ABDOMEN AND PELVIS WITH CONTRAST TECHNIQUE:  Multidetector CT imaging of the abdomen and pelvis was performed using the standard protocol following bolus administration of intravenous contrast. CONTRAST:  75mL OMNIPAQUE IOHEXOL 300 MG/ML  SOLN COMPARISON:  None. FINDINGS: Lower chest:  The included lung bases are clear. Liver: Normal, no focal lesion. Hepatobiliary: Gallbladder physiologically distended, no calcified stones. No biliary dilatation. Pancreas: Normal. Spleen: Normal. Adrenal glands: No nodule. Kidneys: Symmetric renal enhancement. No hydronephrosis. No perinephric stranding or focal renal abnormality. Stomach/Bowel: Stomach physiologically distended. There are no dilated or thickened small bowel loops. Small volume of stool throughout the colon without colonic wall thickening. The appendix is not confidently identified. Vascular/Lymphatic: No retroperitoneal adenopathy. Abdominal aorta is normal in caliber. Reproductive: Diffuse pelvic soft tissue stranding. Bilateral adnexal irregularly-shaped multiloculated fluid collections. This appears larger on the left and extends posteriorly into the midline and right pelvis measuring at least 10 cm. On the right this measures at least 5 cm. The uterus demonstrates heterogeneous enhancement. Bladder: Physiologically distended, no wall thickening. Other: No free air. Musculoskeletal: There are no acute or suspicious osseous abnormalities. IMPRESSION: Bilateral multiloculated pelvic/adnexal fluid collections, most concerning for bilateral hydro/pyosalpinx and tubo-ovarian abscess. Electronically Signed   By: Rubye Oaks M.D.   On: 03/27/2015 22:37   Ct Image Guided Drainage By Percutaneous Catheter  03/29/2015  CLINICAL DATA:  Multiloculated tubo-ovarian abscess. The patient has been referred for percutaneous drainage. EXAM: CT GUIDED DRAINAGE OF PELVIC TUBO-OVARIAN ABSCESS ANESTHESIA/SEDATION: 3.0 Mg IV Versed 100 mcg IV Fentanyl Total Moderate Sedation Time:  45 minutes PROCEDURE: The procedure,  risks, benefits, and alternatives were explained to the patient. Questions regarding the procedure were encouraged and answered. The patient understands and consents to the procedure. A time-out was performed prior to the procedure. The right transgluteal region was prepped with chlorhexidine in a sterile fashion, and a sterile drape was applied covering the operative field. A sterile gown and sterile gloves were used for the procedure. Local anesthesia was provided with 1% Lidocaine. CT was performed of the pelvis in a prone position. From a right transgluteal approach, an 18 gauge trocar needle was advanced. After confirming needle tip position, aspiration drainage was performed through the needle as it was slowly retracted. A fluid sample was sent for culture analysis. The needle was then removed. COMPLICATIONS: None FINDINGS: From a right transgluteal approach, a series of 3 loculations of the complex TOA were targeted and traversed by the trocar needle. This included pockets to the left of midline and at the midline as well as just to the right of the rectum. Aspiration drainage through the needle yielded a total of 35-40 mL  of purulent fluid. A more anterolateral abscess pocket could not be approached safely from this approach due to major pelvic vessels. After drainage, CT shows decompression of the pockets. A drain was not left in place. IMPRESSION: CT-guided needle aspiration drainage of 3 separate loculations of a complex pelvic tubo-ovarian abscess. A total of 35-40 mL of purulent fluid was able to be drained. The right anterolateral abscess pocket could not be approached safely from a percutaneous transgluteal approach. Given good decompression of the loculations by CT after drainage, a drainage catheter was not left in place following the procedure. Electronically Signed   By: Irish LackGlenn  Yamagata M.D.   On: 03/29/2015 11:43     Assessment   24 y.o. [id number] Hospital Day: 6   Plan   1. DC home 2.  [facility] 10 [facility] 3. Continue po antibiotics for UTI Willey Blade(Kelbsiella) 4. Continue Flagyl as ordered. 5.Rx sent with pt

## 2015-04-01 NOTE — Progress Notes (Signed)
D/C order from MD.  Reviewed d/c instructions and prescriptions with patient and answered any questions.  Patient d/c home via wheelchair by nursing/auxillary. 

## 2015-04-02 LAB — CULTURE, BLOOD (ROUTINE X 2)
Culture: NO GROWTH
Culture: NO GROWTH

## 2015-04-20 LAB — FUNGUS CULTURE W SMEAR
Culture: NO GROWTH
Special Requests: NORMAL

## 2016-10-22 ENCOUNTER — Emergency Department: Payer: Self-pay

## 2016-10-22 ENCOUNTER — Encounter: Payer: Self-pay | Admitting: Emergency Medicine

## 2016-10-22 ENCOUNTER — Emergency Department
Admission: EM | Admit: 2016-10-22 | Discharge: 2016-10-23 | Disposition: A | Discharge status: Home / Self Care | Payer: Self-pay | Attending: Emergency Medicine | Admitting: Emergency Medicine

## 2016-10-22 DIAGNOSIS — Z79899 Other long term (current) drug therapy: Secondary | ICD-10-CM | POA: Insufficient documentation

## 2016-10-22 DIAGNOSIS — F17200 Nicotine dependence, unspecified, uncomplicated: Secondary | ICD-10-CM | POA: Insufficient documentation

## 2016-10-22 DIAGNOSIS — N12 Tubulo-interstitial nephritis, not specified as acute or chronic: Secondary | ICD-10-CM

## 2016-10-22 DIAGNOSIS — N10 Acute tubulo-interstitial nephritis: Secondary | ICD-10-CM | POA: Insufficient documentation

## 2016-10-22 DIAGNOSIS — R1013 Epigastric pain: Secondary | ICD-10-CM | POA: Insufficient documentation

## 2016-10-22 LAB — LIPASE, BLOOD: Lipase: 15 U/L (ref 11–51)

## 2016-10-22 LAB — CBC WITH DIFFERENTIAL/PLATELET
Basophils Absolute: 0.1 10*3/uL (ref 0–0.1)
Basophils Relative: 0 %
Eosinophils Absolute: 0.2 10*3/uL (ref 0–0.7)
Eosinophils Relative: 1 %
HCT: 37.9 % (ref 35.0–47.0)
Hemoglobin: 13 g/dL (ref 12.0–16.0)
Lymphocytes Relative: 8 %
Lymphs Abs: 1.3 10*3/uL (ref 1.0–3.6)
MCH: 30.7 pg (ref 26.0–34.0)
MCHC: 34.3 g/dL (ref 32.0–36.0)
MCV: 89.5 fL (ref 80.0–100.0)
Monocytes Absolute: 1.4 10*3/uL — ABNORMAL HIGH (ref 0.2–0.9)
Monocytes Relative: 8 %
Neutro Abs: 13.2 10*3/uL — ABNORMAL HIGH (ref 1.4–6.5)
Neutrophils Relative %: 83 %
Platelets: 303 10*3/uL (ref 150–440)
RBC: 4.23 MIL/uL (ref 3.80–5.20)
RDW: 13.1 % (ref 11.5–14.5)
WBC: 16.1 10*3/uL — ABNORMAL HIGH (ref 3.6–11.0)

## 2016-10-22 LAB — URINALYSIS, COMPLETE (UACMP) WITH MICROSCOPIC
Bacteria, UA: NONE SEEN
Bilirubin Urine: NEGATIVE
Glucose, UA: NEGATIVE mg/dL
Ketones, ur: NEGATIVE mg/dL
Nitrite: NEGATIVE
Protein, ur: 100 mg/dL — AB
Specific Gravity, Urine: 1.024 (ref 1.005–1.030)
pH: 5 (ref 5.0–8.0)

## 2016-10-22 LAB — COMPREHENSIVE METABOLIC PANEL
ALT: 8 U/L — ABNORMAL LOW (ref 14–54)
AST: 18 U/L (ref 15–41)
Albumin: 4.1 g/dL (ref 3.5–5.0)
Alkaline Phosphatase: 108 U/L (ref 38–126)
Anion gap: 13 (ref 5–15)
BUN: 31 mg/dL — ABNORMAL HIGH (ref 6–20)
CO2: 26 mmol/L (ref 22–32)
Calcium: 9.2 mg/dL (ref 8.9–10.3)
Chloride: 97 mmol/L — ABNORMAL LOW (ref 101–111)
Creatinine, Ser: 1.48 mg/dL — ABNORMAL HIGH (ref 0.44–1.00)
GFR calc Af Amer: 56 mL/min — ABNORMAL LOW (ref >60)
GFR calc non Af Amer: 48 mL/min — ABNORMAL LOW (ref >60)
Glucose, Bld: 99 mg/dL (ref 65–99)
Potassium: 2.8 mmol/L — ABNORMAL LOW (ref 3.5–5.1)
Sodium: 136 mmol/L (ref 135–145)
Total Bilirubin: 0.6 mg/dL (ref 0.3–1.2)
Total Protein: 8.9 g/dL — ABNORMAL HIGH (ref 6.5–8.1)

## 2016-10-22 LAB — AMYLASE: Amylase: 31 U/L (ref 28–100)

## 2016-10-22 LAB — POC URINE PREG, ED: Preg Test, Ur: NEGATIVE

## 2016-10-22 MED ORDER — SODIUM CHLORIDE 0.9 % IV BOLUS (SEPSIS)
1000.0000 mL | Freq: Once | INTRAVENOUS | Status: AC
  Administered 2016-10-22: 1000 mL via INTRAVENOUS

## 2016-10-22 MED ORDER — IOPAMIDOL (ISOVUE-300) INJECTION 61%
75.0000 mL | Freq: Once | INTRAVENOUS | Status: AC | PRN
  Administered 2016-10-22: 75 mL via INTRAVENOUS
  Filled 2016-10-22: qty 75

## 2016-10-22 MED ORDER — ONDANSETRON 4 MG PO TBDP
4.0000 mg | ORAL_TABLET | Freq: Once | ORAL | Status: AC
  Administered 2016-10-22: 4 mg via ORAL
  Filled 2016-10-22: qty 1

## 2016-10-22 MED ORDER — LEVOFLOXACIN 750 MG PO TABS: 750.0000 mg | ORAL_TABLET | Freq: Every day | ORAL | 0 refills | Status: AC

## 2016-10-22 MED ORDER — IOPAMIDOL (ISOVUE-300) INJECTION 61%
30.0000 mL | Freq: Once | INTRAVENOUS | Status: AC
  Administered 2016-10-22: 30 mL via ORAL
  Filled 2016-10-22: qty 30

## 2016-10-22 MED ORDER — CEFTRIAXONE SODIUM IN DEXTROSE 20 MG/ML IV SOLN
1.0000 g | Freq: Once | INTRAVENOUS | Status: AC
  Administered 2016-10-22: 1 g via INTRAVENOUS
  Filled 2016-10-22: qty 50

## 2016-10-22 NOTE — ED Triage Notes (Signed)
Diarrhea, vomiting, abd pain x 2 days. vss nad

## 2016-10-22 NOTE — ED Notes (Signed)
Pt c/o epigastric pain at 10/10 x2 days; pt states 4 episodes of emesis and about the same numbers of diarrhea; no blood in either

## 2016-10-22 NOTE — ED Notes (Signed)
Pt finished with contrast, called CT to inform them that pt was ready for scan.

## 2016-10-22 NOTE — ED Provider Notes (Signed)
[facility] Emergency Department Provider Note  ____________________________________________  Time seen: Approximately 9:17 PM  I have reviewed the triage vital signs and the nursing notes.   HISTORY  Chief Complaint Nausea    HPI [name] is a 26 y.o. female presenting to the emergency department with 10 out of 10 epigastric abdominal pain, nausea, vomiting and diarrhea for the past 2 days. Patient has not evaluated her temperature but she has had chills. She denies dysuria, hematuria, increased urinary frequency, bilateral flank pain and changes in vaginal discharge. No dyspareunia.  She denies hemoptysis or hematochezia. Patient denies prior GI surgeries. No recent travel. Patient denies associated chest pain, chest tightness, palpitations and shortness of breath. No sick contacts with similar symptoms.   History reviewed. No pertinent past medical history.  Patient Active Problem List   Diagnosis Date Noted  . TOA (tubo-ovarian abscess) 03/28/2015    History reviewed. No pertinent surgical history.  Prior to Admission medications   Medication Sig Start Date End Date Taking? Authorizing Provider  docusate sodium (COLACE) 100 MG capsule Take 1 capsule (100 mg total) by mouth 2 (two) times daily. 03/31/15   Halfon, Leonette Most, MD  ferrous sulfate (FERROUSUL) 325 (65 FE) MG tablet Take 1 tablet (325 mg total) by mouth 2 (two) times daily with a meal. 03/31/15   Halfon, Leonette Most, MD  ibuprofen (ADVIL,MOTRIN) 200 MG tablet Take 200 mg by mouth every 6 (six) hours as needed for moderate pain.    [provider]  levofloxacin (LEVAQUIN) 750 MG tablet Take 1 tablet (750 mg total) by mouth daily. 10/22/16 10/27/16  Orvil Feil, PA-C  oxyCODONE-acetaminophen (PERCOCET/ROXICET) 5-325 MG tablet Take 1-2 tablets by mouth every 6 (six) hours as needed for moderate pain. 03/31/15   Halfon, Leonette Most, MD    Allergies Augmentin [amoxicillin-pot  clavulanate]  History reviewed. No pertinent family history.  Social History Social History  Substance Use Topics  . Smoking status: Current Every Day Smoker    Packs/day: 0.50  . Smokeless tobacco: Never Used  . Alcohol use No     Review of Systems  Constitutional: No fever/chills Eyes: No visual changes. No discharge ENT: No upper respiratory complaints. Cardiovascular: no chest pain. Respiratory: no cough. No SOB. Gastrointestinal: Patient has had nausea, vomiting and diarrhea. Patient has epigastric abdominal pain. Genitourinary: Negative for dysuria. No hematuria Musculoskeletal: Negative for musculoskeletal pain. Skin: Negative for rash, abrasions, lacerations, ecchymosis. Neurological: Negative for headaches, focal weakness or numbness. ____________________________________________   PHYSICAL EXAM:  VITAL SIGNS: ED Triage Vitals  Enc Vitals Group     BP 10/22/16 1928 111/78     Pulse Rate 10/22/16 1928 90     Resp 10/22/16 1928 16     Temp 10/22/16 1928 98 F (36.7 C)     Temp src --      SpO2 10/22/16 1928 100 %     Weight 10/22/16 1929 120 lb (54.4 kg)     Height 10/22/16 1929 5\' 3"  (1.6 m)     Head Circumference --      Peak Flow --      Pain Score --      Pain Loc --      Pain Edu? --      Excl. in GC? --      Constitutional: Alert and oriented. Well appearing and in no acute distress. Eyes: Conjunctivae are normal. PERRL. EOMI. Head: Atraumatic. Hematological/Lymphatic/Immunilogical: No cervical lymphadenopathy.* Cardiovascular: Normal rate, regular rhythm.  Normal S1 and S2.  Good peripheral circulation. Respiratory: Normal respiratory effort without tachypnea or retractions. Lungs CTAB. Good air entry to the bases with no decreased or absent breath sounds. Gastrointestinal: Bowel sounds 4 quadrants. Soft. Patient has tenderness to palpation along the epigastric abdomen. No guarding or rigidity. No palpable masses. No distention. No CVA  tenderness. Musculoskeletal: Full range of motion to all extremities. No gross deformities appreciated. Neurologic:  Normal speech and language. No gross focal neurologic deficits are appreciated.  Skin:  Skin is warm, dry and intact. No rash noted. Psychiatric: Mood and affect are normal. Speech and behavior are normal. Patient exhibits appropriate insight and judgement.   ____________________________________________   LABS (all labs ordered are listed, but only abnormal results are displayed)  Labs Reviewed  CBC WITH DIFFERENTIAL/PLATELET - Abnormal; Notable for the following:       Result Value   WBC 16.1 (*)    Neutro Abs 13.2 (*)    Monocytes Absolute 1.4 (*)    All other components within normal limits  COMPREHENSIVE METABOLIC PANEL - Abnormal; Notable for the following:    Potassium 2.8 (*)    Chloride 97 (*)    BUN 31 (*)    Creatinine, Ser 1.48 (*)    Total Protein 8.9 (*)    ALT 8 (*)    GFR calc non Af Amer 48 (*)    GFR calc Af Amer 56 (*)    All other components within normal limits  URINALYSIS, COMPLETE (UACMP) WITH MICROSCOPIC - Abnormal; Notable for the following:    Color, Urine AMBER (*)    APPearance TURBID (*)    Hgb urine dipstick LARGE (*)    Protein, ur 100 (*)    Leukocytes, UA MODERATE (*)    Squamous Epithelial / LPF 6-30 (*)    All other components within normal limits  URINE CULTURE  AMYLASE  LIPASE, BLOOD  POC URINE PREG, ED   ____________________________________________  EKG   ____________________________________________  RADIOLOGY  Ct Abdomen Pelvis W Contrast  Result Date: 10/22/2016 CLINICAL DATA:  Epigastric pain for 2 days.  Vomiting. EXAM: CT ABDOMEN AND PELVIS WITH CONTRAST TECHNIQUE: Multidetector CT imaging of the abdomen and pelvis was performed using the standard protocol following bolus administration of intravenous contrast. CONTRAST:  75mL ISOVUE-300 IOPAMIDOL (ISOVUE-300) INJECTION 61% COMPARISON:  CT 03/27/2015  FINDINGS: Lower chest:  The lung bases are clear. Hepatobiliary: No focal liver abnormality is seen. No gallstones, gallbladder wall thickening, or biliary dilatation. Pancreas: No ductal dilatation or inflammation. Spleen: Normal in size without focal abnormality. Adrenals/Urinary Tract: Heterogeneous enhancement of the right kidney with perinephric edema and enhancement of the right ureter and renal pelvis. Small perinephric fluid. No intrarenal or perinephric abscess. Mild right hydroureteronephrosis. No evidence of urolithiasis. Homogeneous enhancement of the left kidney without hydronephrosis. Normal adrenal glands. Urinary bladder is minimally distended, there is bladder wall thickening. Foci of intraluminal are present in the bladder. Stomach/Bowel: Stomach physiologically distended. No bowel wall thickening, inflammation or obstruction. Normal appendix. Vascular/Lymphatic: No significant vascular findings are present. No enlarged abdominal or pelvic lymph nodes. Reproductive: Uterus is unremarkable. A peripherally enhancing 2.4 cm cyst in the left ovary is likely a corpus luteum. Right ovaries normal in size. Trace free fluid in the pelvis. Previous enhancing pelvic fluid collections have otherwise resolved. Other: No free air. Musculoskeletal: There are no acute or suspicious osseous abnormalities. Scattered Schmorl's nodes throughout spine. IMPRESSION: Right pyelonephritis, no evidence of abscess. Probable concurrent cystitis with bladder  wall thickening. Peripherally enhancing 2.4 cm cystic structure in the left adnexa is likely a corpus luteum. Electronically Signed   By: Rubye Oaks M.D.   On: 10/22/2016 23:12    ____________________________________________    PROCEDURES  Procedure(s) performed:    Procedures    Medications  ondansetron (ZOFRAN-ODT) disintegrating tablet 4 mg (4 mg Oral Given 10/22/16 2116)  sodium chloride 0.9 % bolus 1,000 mL (0 mLs Intravenous Stopped 10/22/16  2258)  iopamidol (ISOVUE-300) 61 % injection 30 mL (30 mLs Oral Contrast Given 10/22/16 2123)  cefTRIAXone (ROCEPHIN) 1 g in dextrose 5 % 50 mL IVPB - Premix (0 g Intravenous Stopped 10/22/16 2230)  iopamidol (ISOVUE-300) 61 % injection 75 mL (75 mLs Intravenous Contrast Given 10/22/16 2249)     ____________________________________________   INITIAL IMPRESSION / ASSESSMENT AND PLAN / ED COURSE  Pertinent labs & imaging results that were available during my care of the patient were reviewed by me and considered in my medical decision making (see chart for details).  Review of the [place] CSRS was performed in accordance of the [facility] prior to dispensing any controlled drugs.  Clinical Course as of Oct 24 1546  Thu Oct 22, 2016  2104 CT Abdomen W Contrast [JW]  2104 CT Abdomen W Contrast [JW]    Clinical Course User Index [JW] Orvil Feil, PA-C    Assessment and plan: Pyelonephritis: Patient presents to the emergency department with nausea, vomiting and diarrhea. Urinalysis was concerning for cystitis. CT abdomen indicated findings consistent with pyelonephritis. Patient was given supplemental fluids and ceftriaxone in the emergency department. Patient was advised to consume Gatorade and potassium rich foods. She was discharged with Levaquin. Strict return precautions were given. Patient voiced understanding regarding these return precautions. Vital signs were reassuring prior to discharge. All patient questions were answered.   ____________________________________________  FINAL CLINICAL IMPRESSION(S) / ED DIAGNOSES  Final diagnoses:  Pyelonephritis      NEW MEDICATIONS STARTED DURING THIS VISIT:  Discharge Medication List as of 10/22/2016 11:32 PM    START taking these medications   Details  levofloxacin (LEVAQUIN) 750 MG tablet Take 1 tablet (750 mg total) by mouth daily., Starting Thu 10/22/2016, Until Tue 10/27/2016, Print            This chart was dictated using  voice recognition software/Dragon. Despite best efforts to proofread, errors can occur which can change the meaning. Any change was purely unintentional.    Orvil Feil, PA-C 10/23/16 1548    Minna Antis, MD 10/23/16 [phone]

## 2016-10-23 NOTE — ED Notes (Signed)
Signature pad not working.  Patient verbalized understanding of discharge instructions and has no further questions. 

## 2016-10-25 LAB — URINE CULTURE
Culture: >=100000 — AB
Special Requests: NORMAL

## 2017-08-28 IMAGING — CT CT IMAGE GUIDED DRAINAGE BY PERCUTANEOUS CATHETER
1 of 2 series · 9 of 32 positions shown, 15 images · non-contrast
Comparison: none

CLINICAL DATA: Multiloculated tubo-ovarian abscess. The patient has
been referred for percutaneous drainage.

[Series 4: routine abdomen · axial · 0.68mm/px · z∈[+1084,+1190]mm · 9 of 27 slices shown, 15 images]
[im 3/27  soft-tissue]
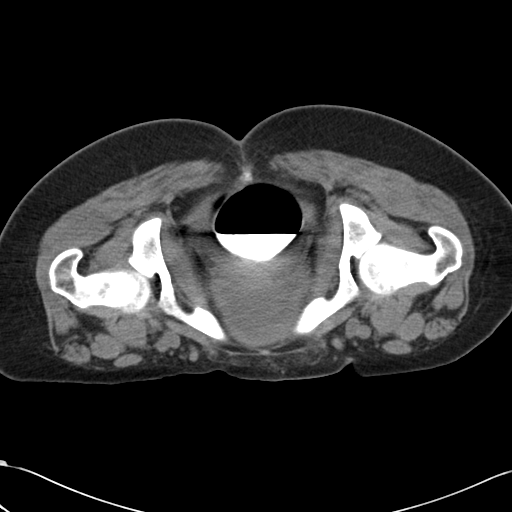
[im 3/27  bone]
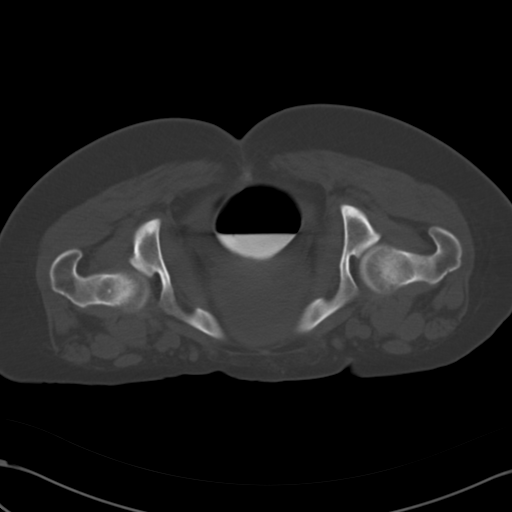
[im 6/27  soft-tissue]
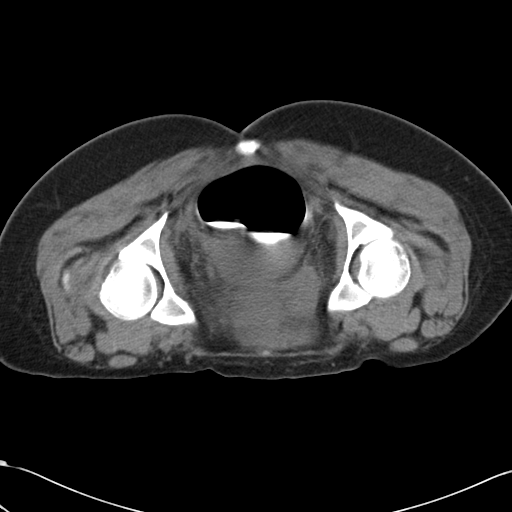
[im 8/27  soft-tissue]
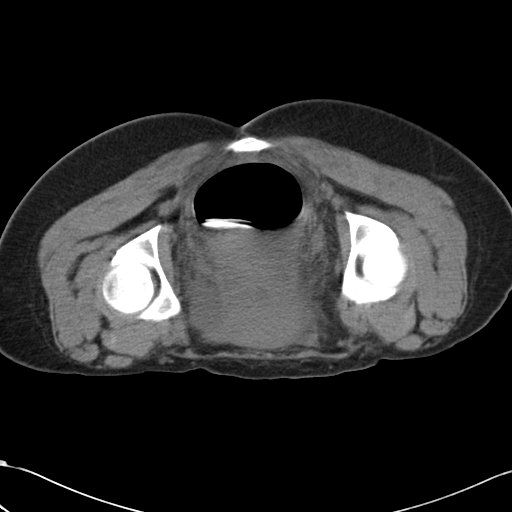
[im 11/27  soft-tissue]
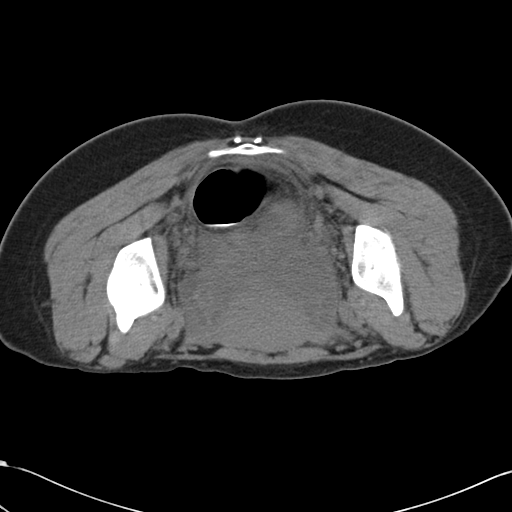
[im 14/27  soft-tissue]
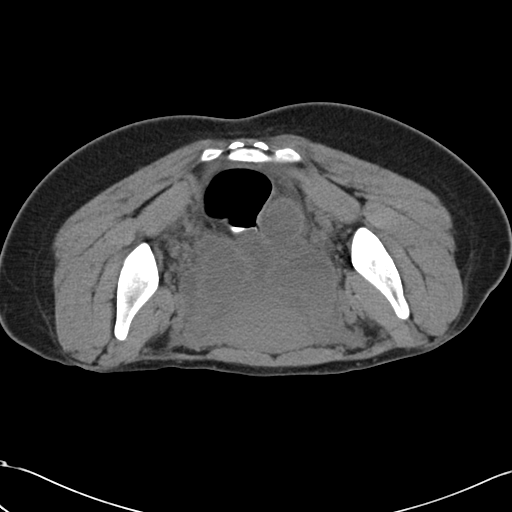
[im 16/27  soft-tissue]
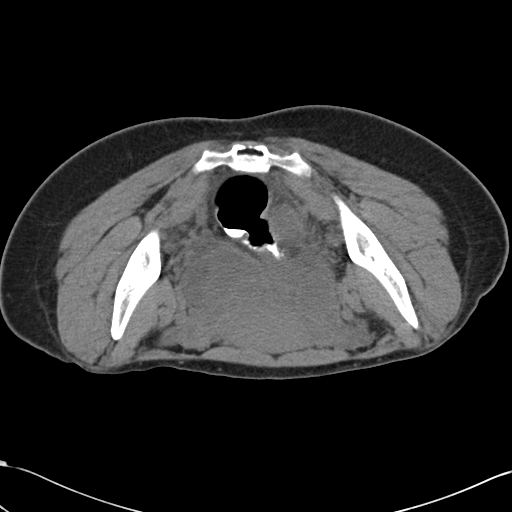
[im 16/27  lung]
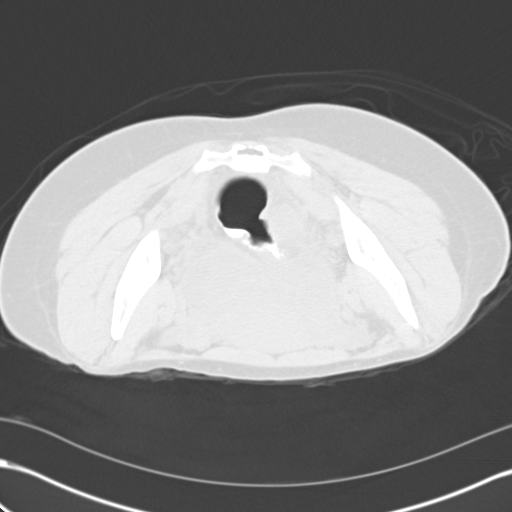
[im 19/27  soft-tissue]
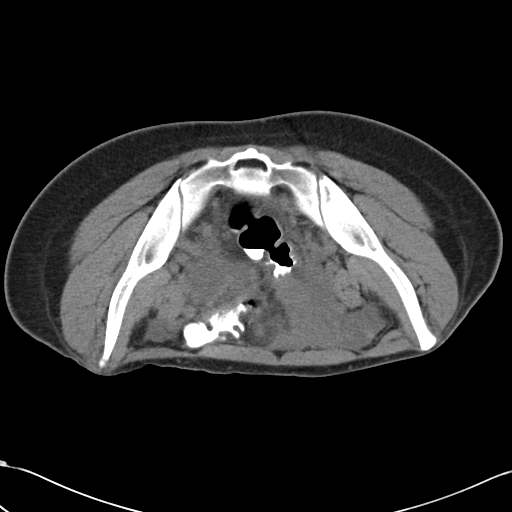
[im 19/27  lung]
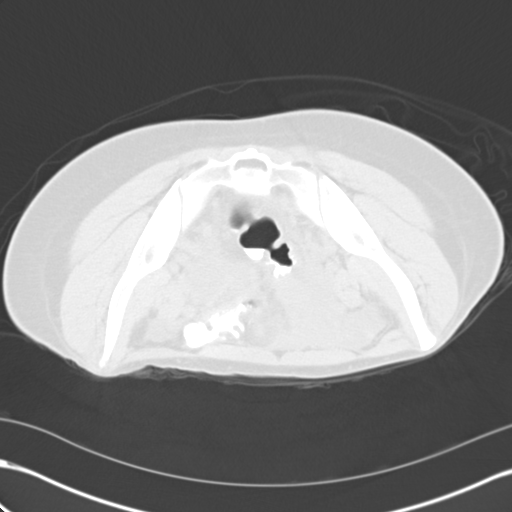
[im 21/27  soft-tissue]
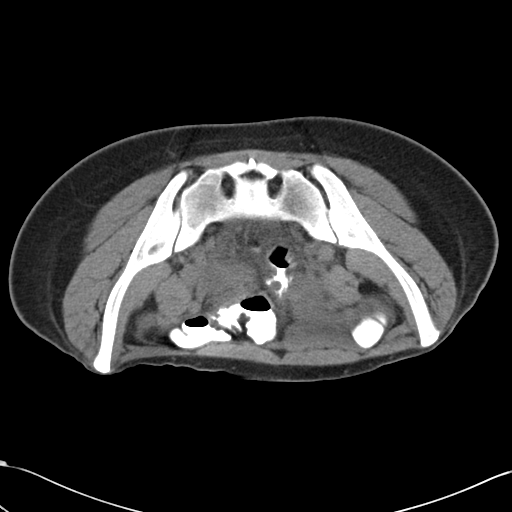
[im 21/27  lung]
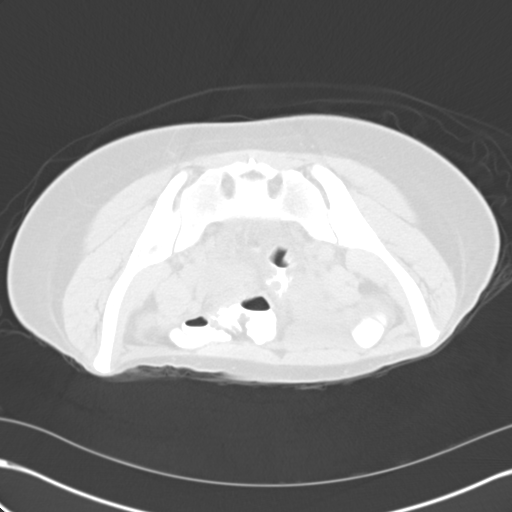
[im 24/27  soft-tissue]
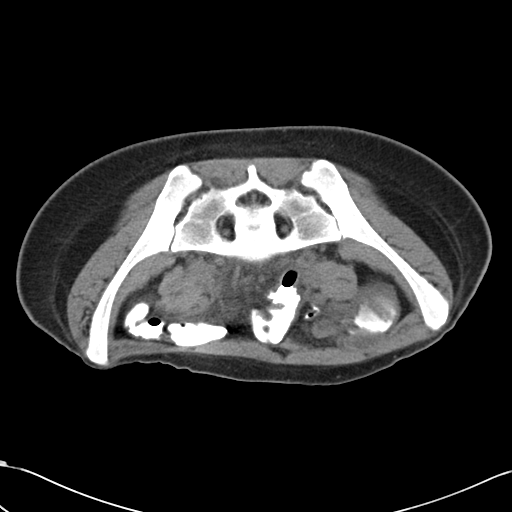
[im 24/27  lung]
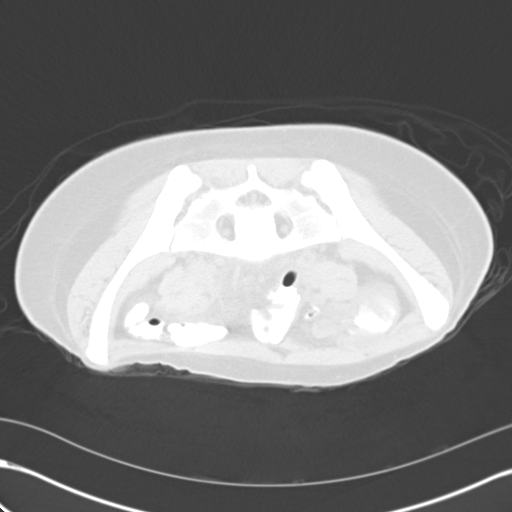
[im 24/27  bone]
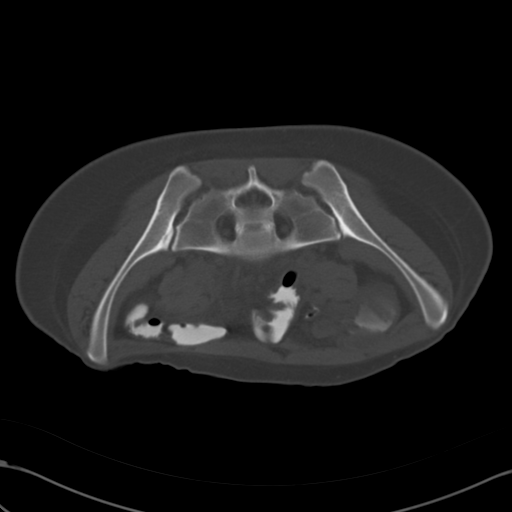

[9 of 32 positions shown; findings below may reference images not displayed]

EXAM:
CT GUIDED DRAINAGE OF PELVIC TUBO-OVARIAN ABSCESS

ANESTHESIA/SEDATION:
3.0 Mg IV Versed 100 mcg IV Fentanyl

Total Moderate Sedation Time:  45 minutes

PROCEDURE:
The procedure, risks, benefits, and alternatives were explained to
the patient. Questions regarding the procedure were encouraged and
answered. The patient understands and consents to the procedure. A
time-out was performed prior to the procedure.

The right transgluteal region was prepped with chlorhexidine in a
sterile fashion, and a sterile drape was applied covering the
operative field. A sterile gown and sterile gloves were used for the
procedure. Local anesthesia was provided with 1% Lidocaine.

CT was performed of the pelvis in a prone position. From a right
transgluteal approach, an 18 gauge trocar needle was advanced. After
confirming needle tip position, aspiration drainage was performed
through the needle as it was slowly retracted. A fluid sample was
sent for culture analysis. The needle was then removed.

COMPLICATIONS:
None
FINDINGS: From a right transgluteal approach, a series of 3 loculations of the
complex TOA were targeted and traversed by the trocar needle. This
included pockets to the left of midline and at the midline as well
as just to the right of the rectum. Aspiration drainage through the
needle yielded a total of 35-40 mL of purulent fluid. A more
anterolateral abscess pocket could not be approached safely from
this approach due to major pelvic vessels.

After drainage, CT shows decompression of the pockets. A drain was
not left in place.
IMPRESSION: CT-guided needle aspiration drainage of 3 separate loculations of a
complex pelvic tubo-ovarian abscess. A total of 35-40 mL of purulent
fluid was able to be drained. The right anterolateral abscess pocket
could not be approached safely from a percutaneous transgluteal
approach. Given good decompression of the loculations by CT after
drainage, a drainage catheter was not left in place following the
procedure.

## 2019-03-24 IMAGING — CT CT ABD-PELV W/ CM
2 of 4 series · 15 of 46 positions shown, 17 images · IV contrast (APPLIED)
Comparison: CT 03/27/2015

CLINICAL DATA: Epigastric pain for 2 days.  Vomiting.

EXAM:
CT ABDOMEN AND PELVIS WITH CONTRAST
TECHNIQUE: Multidetector CT imaging of the abdomen and pelvis was performed
using the standard protocol following bolus administration of
intravenous contrast.
CONTRAST:  75mL FCYYCE-XVV IOPAMIDOL (FCYYCE-XVV) INJECTION 61%

[Series 2: routine abd/pel with · axial · 0.66mm/px · z∈[-318,+72]mm · 12 of 90 slices shown, 14 images]
[im 8/90  soft-tissue]
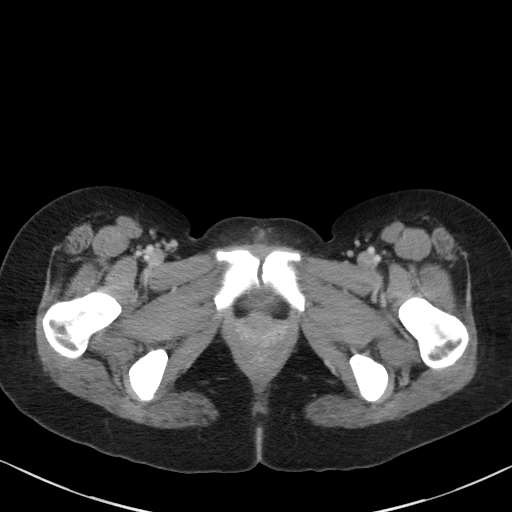
[im 8/90  bone]
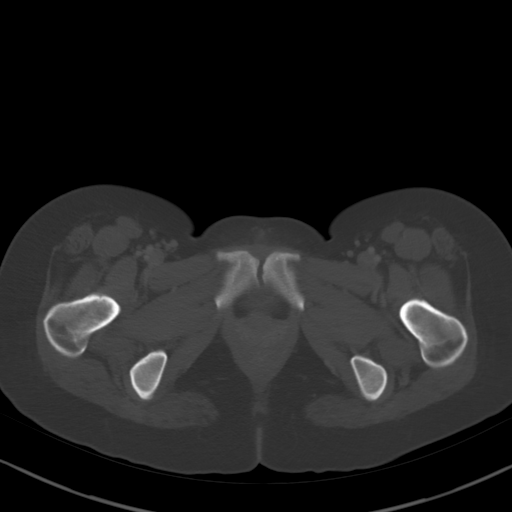
[im 15/90  soft-tissue]
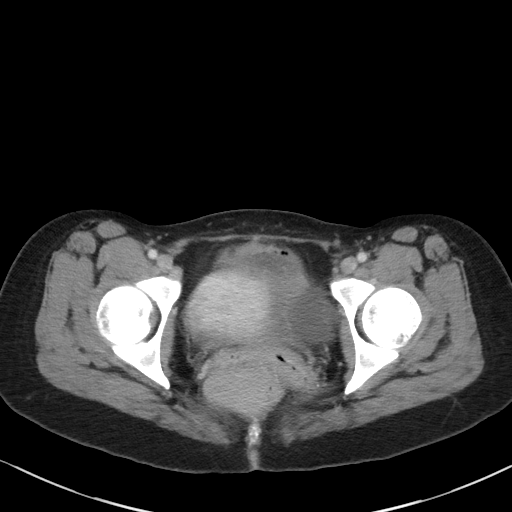
[im 22/90  soft-tissue]
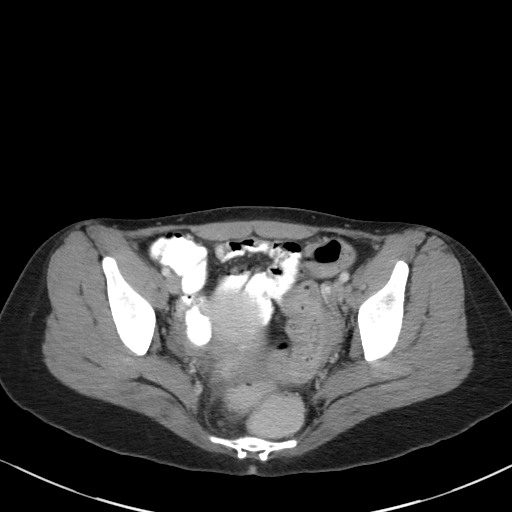
[im 29/90  soft-tissue]
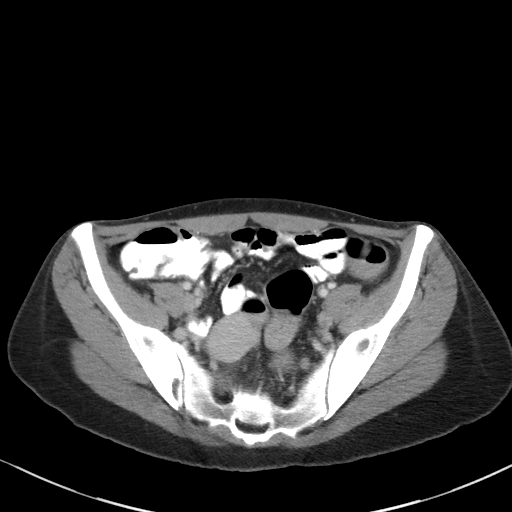
[im 36/90  soft-tissue]
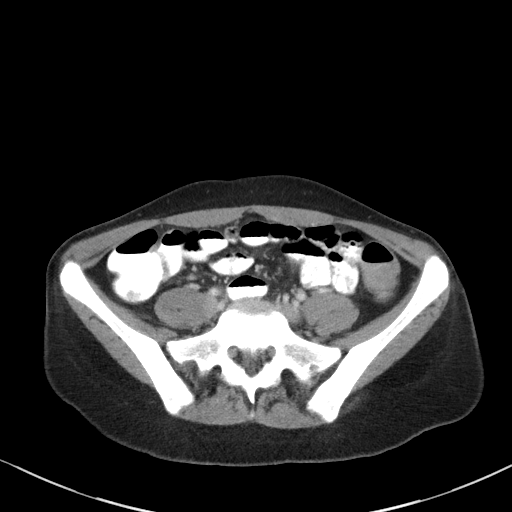
[im 43/90  soft-tissue]
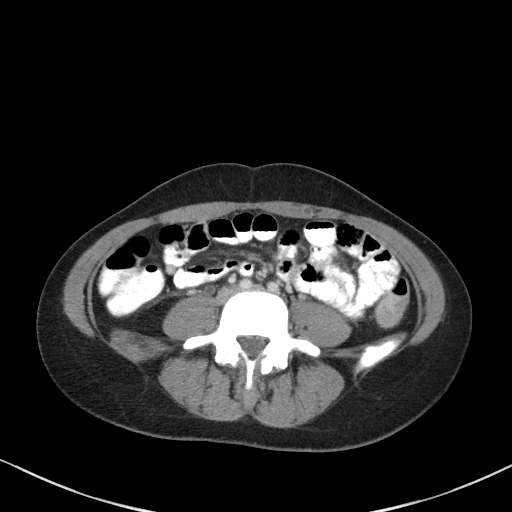
[im 50/90  soft-tissue]
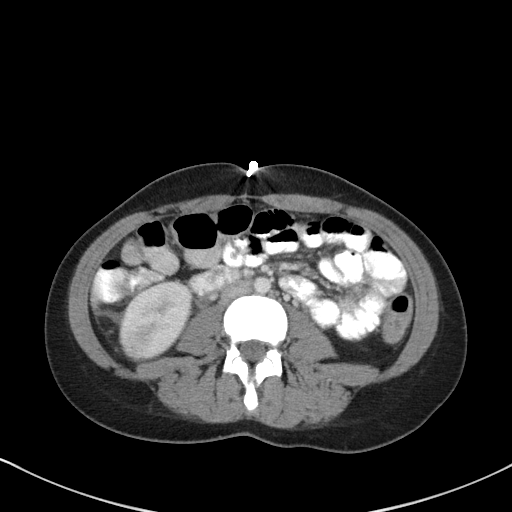
[im 57/90  soft-tissue]
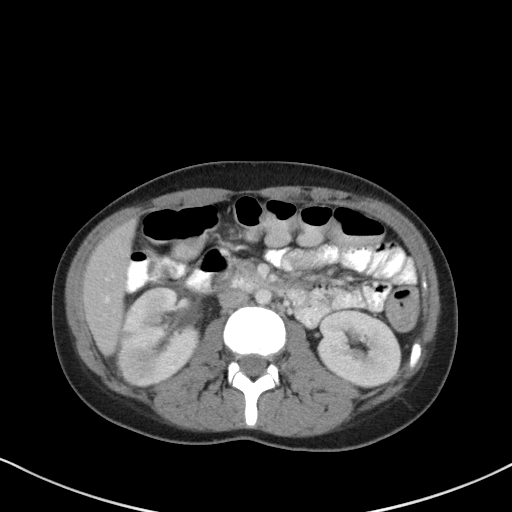
[im 65/90  soft-tissue]
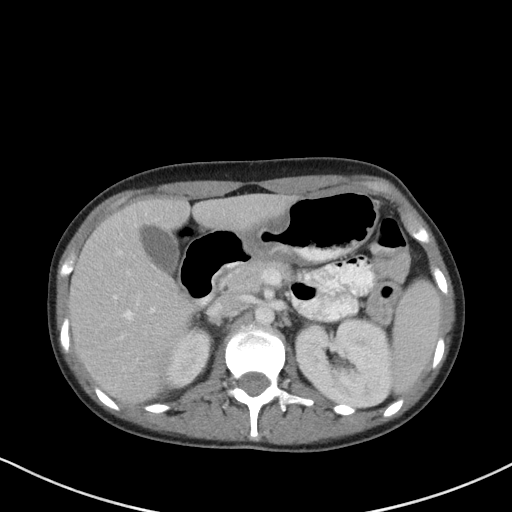
[im 65/90  bone]
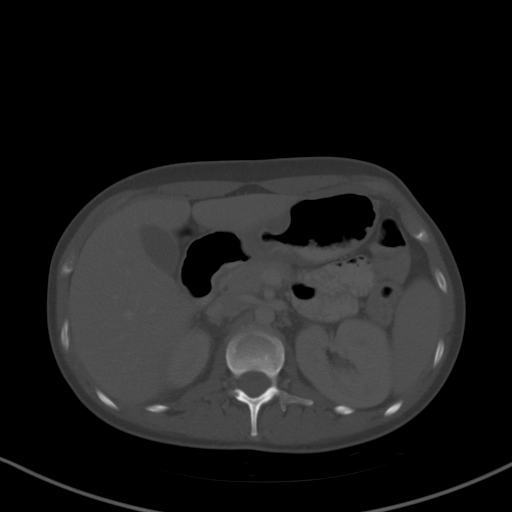
[im 72/90  soft-tissue]
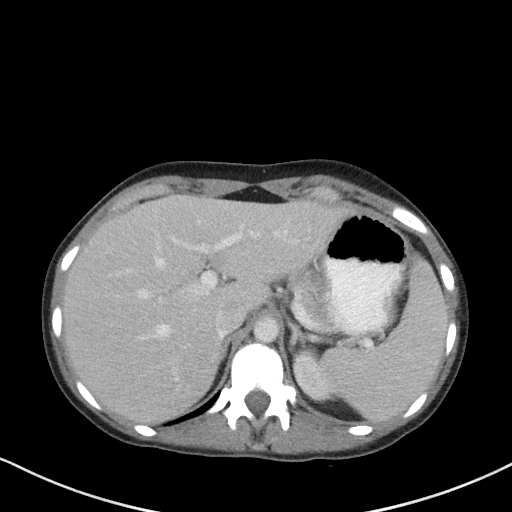
[im 79/90  soft-tissue]
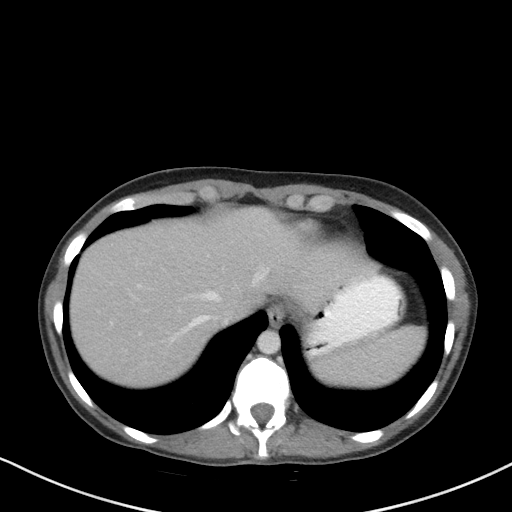
[im 86/90  soft-tissue]
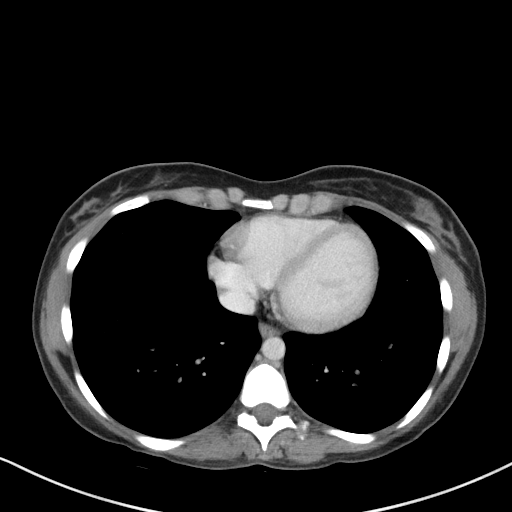

[Series 5: coronal st · coronal · 0.60mm/px · 3 of 67 slices shown]
[im 23/67  soft-tissue]
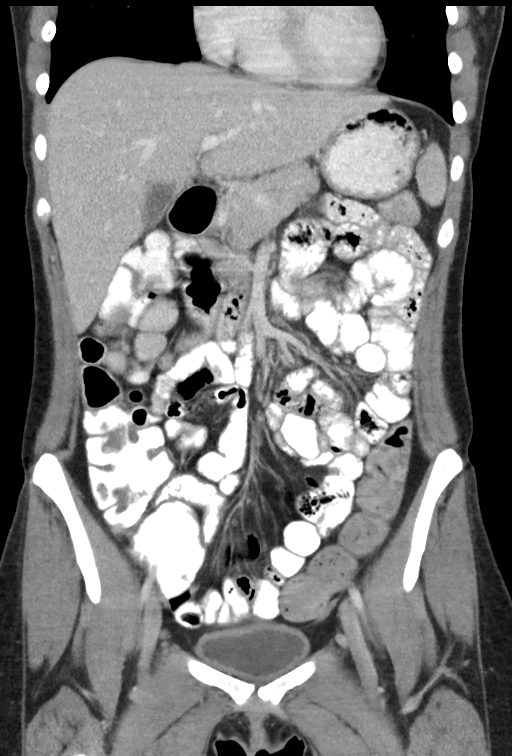
[im 30/67  soft-tissue]
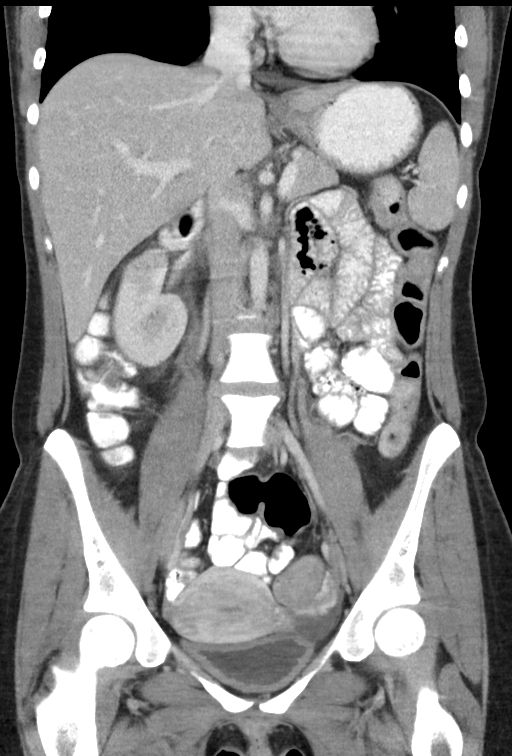
[im 37/67  soft-tissue]
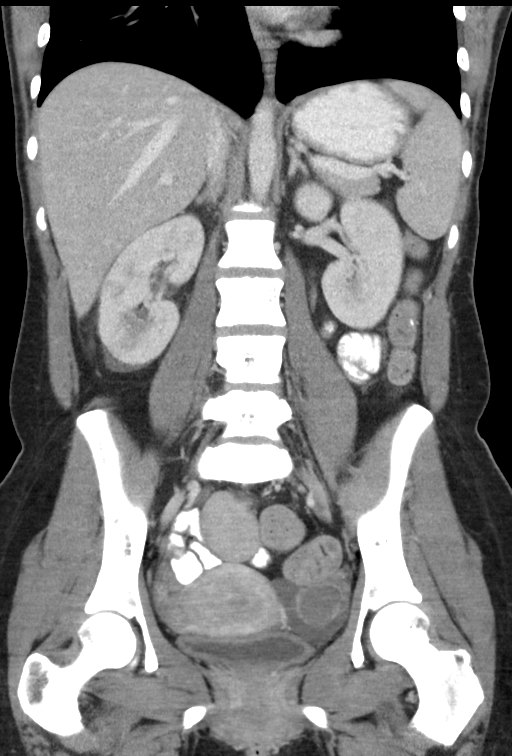

[15 of 46 positions shown; findings below may reference images not displayed]

FINDINGS: Lower chest:  The lung bases are clear.

Hepatobiliary: No focal liver abnormality is seen. No gallstones,
gallbladder wall thickening, or biliary dilatation.

Pancreas: No ductal dilatation or inflammation.

Spleen: Normal in size without focal abnormality.

Adrenals/Urinary Tract: Heterogeneous enhancement of the right
kidney with perinephric edema and enhancement of the right ureter
and renal pelvis. Small perinephric fluid. No intrarenal or
perinephric abscess. Mild right hydroureteronephrosis. No evidence
of urolithiasis. Homogeneous enhancement of the left kidney without
hydronephrosis. Normal adrenal glands. Urinary bladder is minimally
distended, there is bladder wall thickening. Foci of intraluminal
are present in the bladder.

Stomach/Bowel: Stomach physiologically distended. No bowel wall
thickening, inflammation or obstruction. Normal appendix.

Vascular/Lymphatic: No significant vascular findings are present. No
enlarged abdominal or pelvic lymph nodes.

Reproductive: Uterus is unremarkable. A peripherally enhancing
cm cyst in the left ovary is likely a corpus luteum. Right ovaries
normal in size. Trace free fluid in the pelvis. Previous enhancing
pelvic fluid collections have otherwise resolved.

Other: No free air.

Musculoskeletal: There are no acute or suspicious osseous
abnormalities. Scattered Schmorl's nodes throughout spine.
IMPRESSION: Right pyelonephritis, no evidence of abscess. Probable concurrent
cystitis with bladder wall thickening.

Peripherally enhancing 2.4 cm cystic structure in the left adnexa is
likely a corpus luteum.

## 2020-12-25 ENCOUNTER — Other Ambulatory Visit (HOSPITAL_COMMUNITY): Payer: Self-pay | Admitting: NURSE PRACTITIONER

## 2022-03-23 ENCOUNTER — Emergency Department (HOSPITAL_COMMUNITY): Payer: Medicaid Other

## 2022-03-23 ENCOUNTER — Inpatient Hospital Stay (HOSPITAL_COMMUNITY): Payer: Self-pay | Admitting: Pulmonary Disease

## 2022-03-23 ENCOUNTER — Encounter (HOSPITAL_COMMUNITY): Payer: Self-pay | Admitting: Family

## 2022-03-23 ENCOUNTER — Inpatient Hospital Stay
Admission: EM | Admit: 2022-03-23 | Discharge: 2022-03-24 | DRG: 917 | Discharge status: Against Medical Advice | Payer: Medicaid Other | Attending: Pulmonary Disease | Admitting: Pulmonary Disease

## 2022-03-23 ENCOUNTER — Other Ambulatory Visit: Payer: Self-pay

## 2022-03-23 DIAGNOSIS — T50904A Poisoning by unspecified drugs, medicaments and biological substances, undetermined, initial encounter: Secondary | ICD-10-CM | POA: Diagnosis present

## 2022-03-23 DIAGNOSIS — G9341 Metabolic encephalopathy: Secondary | ICD-10-CM

## 2022-03-23 DIAGNOSIS — R451 Restlessness and agitation: Secondary | ICD-10-CM

## 2022-03-23 DIAGNOSIS — T50901A Poisoning by unspecified drugs, medicaments and biological substances, accidental (unintentional), initial encounter: Secondary | ICD-10-CM

## 2022-03-23 DIAGNOSIS — R4689 Other symptoms and signs involving appearance and behavior: Secondary | ICD-10-CM

## 2022-03-23 DIAGNOSIS — F1120 Opioid dependence, uncomplicated: Secondary | ICD-10-CM | POA: Diagnosis present

## 2022-03-23 DIAGNOSIS — G928 Other toxic encephalopathy: Secondary | ICD-10-CM | POA: Diagnosis present

## 2022-03-23 DIAGNOSIS — G929 Unspecified toxic encephalopathy: Secondary | ICD-10-CM | POA: Diagnosis present

## 2022-03-23 DIAGNOSIS — F1910 Other psychoactive substance abuse, uncomplicated: Secondary | ICD-10-CM

## 2022-03-23 DIAGNOSIS — R4182 Altered mental status, unspecified: Secondary | ICD-10-CM

## 2022-03-23 DIAGNOSIS — T402X1A Poisoning by other opioids, accidental (unintentional), initial encounter: Principal | ICD-10-CM | POA: Diagnosis present

## 2022-03-23 DIAGNOSIS — Z20822 Contact with and (suspected) exposure to covid-19: Secondary | ICD-10-CM | POA: Diagnosis present

## 2022-03-23 DIAGNOSIS — F1920 Other psychoactive substance dependence, uncomplicated: Secondary | ICD-10-CM

## 2022-03-23 HISTORY — DX: Other specified health status: Z78.9

## 2022-03-23 LAB — ARTERIAL BLOOD GAS/LACTATE
%FIO2 (ARTERIAL): 21 %
BASE EXCESS (ARTERIAL): 0.1 mmol/L (ref <=2.0)
BICARBONATE (ARTERIAL): 24.6 mmol/L (ref 20.0–26.0)
CARBOXYHEMOGLOBIN: 4.8 % — ABNORMAL HIGH (ref <=1.5)
LACTATE: 0.8 mmol/L (ref <=2.0)
MET-HEMOGLOBIN: 0.2 % (ref <=2.0)
O2CT: 18 %
OXYHEMOGLOBIN: 91.4 % (ref 88.0–100.0)
PCO2 (ARTERIAL): 38 mm/Hg (ref 35–45)
PH (ARTERIAL): 7.42 (ref 7.35–7.45)
PO2 (ARTERIAL): 75 mm/Hg — ABNORMAL LOW (ref 80–100)

## 2022-03-23 LAB — LACTIC ACID LEVEL W/ REFLEX FOR LEVEL >2.0: LACTIC ACID: 0.7 mmol/L (ref 0.5–2.2)

## 2022-03-23 LAB — CBC WITH DIFF
BASOPHIL #: 0 10*3/uL (ref 0.00–0.10)
BASOPHIL %: 1 % (ref 0–1)
EOSINOPHIL #: 0.1 10*3/uL (ref 0.00–0.50)
EOSINOPHIL %: 1 %
HCT: 38.3 % (ref 31.2–41.9)
HGB: 13.4 g/dL (ref 10.9–14.3)
LYMPHOCYTE #: 1.6 10*3/uL (ref 1.00–3.00)
LYMPHOCYTE %: 16 % (ref 16–44)
MCH: 29.8 pg (ref 24.7–32.8)
MCHC: 35 g/dL (ref 32.3–35.6)
MCV: 85.1 fL (ref 75.5–95.3)
MONOCYTE #: 0.6 10*3/uL (ref 0.30–1.00)
MONOCYTE %: 6 % (ref 5–13)
MPV: 8.8 fL (ref 7.9–10.8)
NEUTROPHIL #: 7.3 10*3/uL (ref 1.85–7.80)
NEUTROPHIL %: 76 % (ref 43–77)
PLATELETS: 223 10*3/uL (ref 140–440)
RBC: 4.5 10*6/uL (ref 3.63–4.92)
RDW: 14.2 % (ref 12.3–17.7)
WBC: 9.6 10*3/uL (ref 3.8–11.8)

## 2022-03-23 LAB — MAGNESIUM: MAGNESIUM: 1.9 mg/dL (ref 1.9–2.7)

## 2022-03-23 LAB — URINALYSIS, MACROSCOPIC
BILIRUBIN: NEGATIVE mg/dL
BLOOD: NEGATIVE mg/dL
GLUCOSE: NEGATIVE mg/dL
KETONES: NEGATIVE mg/dL
LEUKOCYTES: NEGATIVE WBCs/uL
NITRITE: NEGATIVE
PH: 6 (ref 5.0–9.0)
PROTEIN: NEGATIVE mg/dL
SPECIFIC GRAVITY: 1.004 (ref 1.002–1.030)
UROBILINOGEN: NORMAL mg/dL

## 2022-03-23 LAB — URINALYSIS, MICROSCOPIC: WBCS: <1 /hpf (ref <6)

## 2022-03-23 LAB — ETHANOL, SERUM/PLASMA: ETHANOL: <10 mg/dL — ABNORMAL HIGH

## 2022-03-23 LAB — COMPREHENSIVE METABOLIC PANEL, NON-FASTING
ALBUMIN/GLOBULIN RATIO: 1.4 (ref 0.8–1.4)
ALBUMIN: 4.3 g/dL (ref 3.5–5.7)
ALKALINE PHOSPHATASE: 83 U/L (ref 34–104)
ALT (SGPT): 12 U/L (ref 7–52)
ANION GAP: 7 mmol/L (ref 4–13)
AST (SGOT): 19 U/L (ref 13–39)
BILIRUBIN TOTAL: 0.4 mg/dL (ref 0.3–1.2)
BUN/CREA RATIO: 21 (ref 6–22)
BUN: 12 mg/dL (ref 7–25)
CALCIUM, CORRECTED: 8.6 mg/dL — ABNORMAL LOW (ref 8.9–10.8)
CALCIUM: 8.9 mg/dL (ref 8.6–10.3)
CHLORIDE: 109 mmol/L — ABNORMAL HIGH (ref 98–107)
CO2 TOTAL: 24 mmol/L (ref 21–31)
CREATININE: 0.57 mg/dL — ABNORMAL LOW (ref 0.60–1.30)
ESTIMATED GFR: 125 mL/min/{1.73_m2} (ref >59)
GLOBULIN: 3.1 (ref 2.9–5.4)
GLUCOSE: 106 mg/dL (ref 74–109)
OSMOLALITY, CALCULATED: 280 mOsm/kg (ref 270–290)
POTASSIUM: 3.4 mmol/L — ABNORMAL LOW (ref 3.5–5.1)
PROTEIN TOTAL: 7.4 g/dL (ref 6.4–8.9)
SODIUM: 140 mmol/L (ref 136–145)

## 2022-03-23 LAB — COVID-19, FLU A/B, RSV RAPID BY PCR
INFLUENZA VIRUS TYPE A: NOT DETECTED
INFLUENZA VIRUS TYPE B: NOT DETECTED
RESPIRATORY SYNCTIAL VIRUS (RSV): NOT DETECTED
SARS-CoV-2: NOT DETECTED

## 2022-03-23 LAB — URINE DRUG SCREEN
AMPHET QL: NEGATIVE
BARB QL: NEGATIVE
BENZO QL: NEGATIVE
BUP QL: NEGATIVE
CANNAQL: NEGATIVE
COCQL: NEGATIVE
FENTANYL, RANDOM URINE: POSITIVE — AB
OPIATE: NEGATIVE
OXYCODONE URINE: NEGATIVE
PCP QL: NEGATIVE

## 2022-03-23 LAB — AMMONIA: AMMONIA: 29 umol/L (ref 16–53)

## 2022-03-23 LAB — PT/INR
INR: 1.07 (ref <=5.00)
PROTHROMBIN TIME: 12.4 seconds (ref 9.8–12.7)

## 2022-03-23 LAB — C-REACTIVE PROTEIN (CRP): C-REACTIVE PROTEIN (CRP): 0.1 mg/dL (ref 0.1–0.5)

## 2022-03-23 LAB — THYROXINE, FREE (FREE T4): THYROXINE (T4), FREE: 0.97 ng/dL (ref 0.58–1.64)

## 2022-03-23 LAB — HCG QUALITATIVE PREGNANCY, SERUM: PREGNANCY, SERUM QUALITATIVE: NEGATIVE

## 2022-03-23 LAB — B-TYPE NATRIURETIC PEPTIDE (BNP),PLASMA: BNP: <25 pg/mL (ref 5–100)

## 2022-03-23 LAB — THYROID STIMULATING HORMONE WITH FREE T4 REFLEX: TSH: 0.23 u[IU]/mL — ABNORMAL LOW (ref 0.450–5.330)

## 2022-03-23 LAB — PTT (PARTIAL THROMBOPLASTIN TIME): APTT: 33.3 seconds (ref 26.0–36.0)

## 2022-03-23 LAB — CREATINE KINASE (CK), TOTAL, SERUM OR PLASMA: CREATINE KINASE: 168 U/L (ref 30–223)

## 2022-03-23 LAB — LIPASE: LIPASE: 14 U/L (ref 11–82)

## 2022-03-23 LAB — TROPONIN-I: TROPONIN I: 3 ng/L (ref <15)

## 2022-03-23 MED ORDER — ZIPRASIDONE 20 MG/ML (FINAL CONCENTRATION) INTRAMUSCULAR SOLUTION
INTRAMUSCULAR | Status: AC
Start: 2022-03-23 — End: 2022-03-23
  Filled 2022-03-23: qty 1

## 2022-03-23 MED ORDER — SODIUM CHLORIDE 0.9 % IV BOLUS
2000.0000 mL | INJECTION | Status: AC
Start: 2022-03-23 — End: 2022-03-23
  Administered 2022-03-23: 2000 mL via INTRAVENOUS
  Administered 2022-03-23: 0 mL via INTRAVENOUS

## 2022-03-23 MED ORDER — LORAZEPAM 2 MG/ML INJECTION WRAPPER
2.0000 mg | INTRAMUSCULAR | Status: AC
Start: 2022-03-23 — End: 2022-03-23
  Administered 2022-03-23: 2 mg via INTRAMUSCULAR

## 2022-03-23 MED ORDER — SODIUM CHLORIDE 0.9 % INTRAVENOUS SOLUTION
INTRAVENOUS | Status: DC
Start: 2022-03-23 — End: 2022-03-24
  Administered 2022-03-24: 0 mL via INTRAVENOUS

## 2022-03-23 MED ORDER — NALOXONE 1 MG/ML INJECTION SYRINGE
INJECTION | INTRAMUSCULAR | Status: AC
Start: 2022-03-23 — End: 2022-03-23
  Filled 2022-03-23: qty 2

## 2022-03-23 MED ORDER — ZIPRASIDONE 20 MG/ML (FINAL CONCENTRATION) INTRAMUSCULAR SOLUTION
5.0000 mg | INTRAMUSCULAR | Status: DC | PRN
Start: 2022-03-23 — End: 2022-03-24
  Administered 2022-03-23 – 2022-03-24 (×2): 5 mg via INTRAMUSCULAR

## 2022-03-23 MED ORDER — KETAMINE 100 MG/ML INJECTION SOLUTION
INTRAMUSCULAR | Status: AC
Start: 2022-03-23 — End: 2022-03-23
  Filled 2022-03-23: qty 5

## 2022-03-23 MED ORDER — NALOXONE 1 MG/ML INJECTION SYRINGE
2.0000 mg | INJECTION | INTRAMUSCULAR | Status: AC
Start: 2022-03-23 — End: 2022-03-23
  Administered 2022-03-23: 2 mg via NASAL

## 2022-03-23 MED ORDER — LORAZEPAM 2 MG/ML INJECTION SYRINGE
INJECTION | INTRAMUSCULAR | Status: AC
Start: 2022-03-23 — End: 2022-03-23
  Filled 2022-03-23: qty 1

## 2022-03-23 MED ORDER — KETAMINE 100 MG/ML INJECTION SOLUTION
5.0000 mg/kg | INTRAMUSCULAR | Status: DC
Start: 2022-03-23 — End: 2022-03-23
  Administered 2022-03-23: 318 mg via INTRAMUSCULAR

## 2022-03-23 MED ORDER — ENOXAPARIN 30 MG/0.3 ML SUBCUTANEOUS SYRINGE
30.0000 mg | INJECTION | SUBCUTANEOUS | Status: DC
Start: 2022-03-23 — End: 2022-03-24
  Administered 2022-03-23: 30 mg via SUBCUTANEOUS

## 2022-03-23 NOTE — ED Nurses Note (Signed)
Nurse in to see patient. PT still resting in bed at this time. Cardiac monitoring and blood pressure/pulse ox still ongoing.

## 2022-03-23 NOTE — H&P (Cosign Needed)
03/23/2022  19:21  CHERMAINE SCHNYDER  [id number]    Chief Complaint: Altered mental status, drug overdose.     History:  31 year old female patient that presents to the emergency department today for altered mental status and severe agitation.  Patient drug screen positive for fentanyl only.  ER provider, Dr. Felton Clinton DO reports the patient acts more like methamphetamine.  However methamphetamine negative and tox screen.  No family present unable to ask any history questions.  Unable to determine review of systems.  Patient had to be given IM Ativan, Geodon and then ketamine to help with severe agitation.  Patient resting quietly at time of examination.      Review of systems:  Unable to determine due to sedation.    Past Medical History      No Known Allergies  Past Medical History:   Diagnosis Date    No pertinent past medical history          History reviewed. No past surgical history pertinent negatives.      Family Medical History:    Family history is unknown by patient.         Social History     Socioeconomic History    Marital status: Single   Tobacco Use    Smoking status: Unknown   Substance and Sexual Activity    Drug use: Yes     Types: IV     Comment: fentanyl positive       Physical exam:  BP 120/80   Pulse 62   Temp (!) 34.9 C (94.8 F)   Resp 17   SpO2 98%       Gen: This is a 31 y.o.  Year-old female who is sedated and resting with no acute distress  CV:  Regular rate and rhythm without murmurs rubs or gallops.  2+ pedal and radial pulses. No clubbing, cyanosis, or edema.  RESP:  Clear to auscultation bilaterally without wheezes rales or rhonchi.  Respirations are nonlabored.  GI:  Abdomen is soft, nontender, nondistended.  Bowel sounds normoactive.  GU: No foley catheter is present  MSK:  Full range of motion of upper and lower extremities  NEURO:  Cranial nerves 2-12 grossly intact. Unable to determine due to sedation.   SKIN: Warm, dry, intact, without lesions, rashes, or  ulcerations      Diagnostic Labs:  Results for orders placed or performed during the hospital encounter of 03/23/22 (from the past 24 hour(s))   URINE DRUG SCREEN   Result Value Ref Range    AMPHET QL Negative Negative    BARB QL Negative Negative    BENZO QL Negative Negative    COCQL Negative Negative    OPIATE Negative Negative    PCP QL Negative Negative    CANNAQL Negative Negative    BUP QL Negative Negative    OXYCODONE URINE Negative Negative    FENTANYL, RANDOM URINE Positive (A) Negative    Narrative    Any results reported as "positive" on this urine drug screen are unconfirmed screening results and should be used for medical(i.e.,treatment)purposes only. Unconfirmed screening results must not be used for non-medical purposes (e.g. employment or legal testing). Upon request, all results reported as "positive" can be sent to a reference laboratory for confirmation by [facility].     Reporting Limits (cut-off concentrations)     Cocaine 300 ng/mL  Opiates 300 ng/mL  THC 50 ng/mL  Amphetamine 1000 ng/mL  Phencyclidine 25 ng/mL  Benzodiazepine 300  ng/mL  Barbiturates 300 ng/mL  Methadone 300 ng/mL  Oxycodone 100 ng/mL  Buprenorphine 5 ng/mL  Fentanyl 5 ng/mL     URINALYSIS, MACROSCOPIC AND MICROSCOPIC W/CULTURE REFLEX    Specimen: Urine, Clean Catch    Narrative    The following orders were created for panel order URINALYSIS, MACROSCOPIC AND MICROSCOPIC W/CULTURE REFLEX.  Procedure                               Abnormality         Status                     ---------                               -----------         ------                     URINALYSIS, MACROSCOPIC[[id number]]      Normal              Final result               URINALYSIS, MICROSCOPIC[[id number]]      Abnormal            Final result                 Please view results for these tests on the individual orders.   TROPONIN-I   Result Value Ref Range    TROPONIN I 3 <15 ng/L   THYROID STIMULATING HORMONE WITH FREE T4 REFLEX   Result Value Ref Range     TSH 0.230 (L) 0.450 - 5.330 uIU/mL   LACTIC ACID LEVEL W/ REFLEX FOR LEVEL >2.0   Result Value Ref Range    LACTIC ACID 0.7 0.5 - 2.2 mmol/L   LIPASE   Result Value Ref Range    LIPASE 14 11 - 82 U/L   MAGNESIUM   Result Value Ref Range    MAGNESIUM 1.9 1.9 - 2.7 mg/dL   PT/INR   Result Value Ref Range    PROTHROMBIN TIME 12.4 9.8 - 12.7 seconds    INR 1.07 <=5.00    Narrative    INR OF 2.0-3.0  RECOMMENDED FOR: PROPHYLAXIS/TREATMENT OF VENEOUS THROMBOSIS, PULMONARY EMBOLISM, PREVENTION OF SYSTEMIC EMBOLISM FROM ATRIAL FIBRILATION, MYOCARDIAL INFARCTION.    INR OF 2.5-3.5  RECOMMENDED FOR MECHANICAL PROSTHETIC HEART VALVES, RECURRENT SYSTEMIC EMBOLISM, RECURRENT MYOCARDIAL INFARCTION.     PTT (PARTIAL THROMBOPLASTIN TIME)   Result Value Ref Range    APTT 33.3 26.0 - 36.0 seconds   ARTERIAL BLOOD GAS/LACTATE   Result Value Ref Range    PH (ARTERIAL) 7.42 7.35 - 7.45    PCO2 (ARTERIAL) 38 35 - 45 mm/Hg    BICARBONATE (ARTERIAL) 24.6 20.0 - 26.0 mmol/L    BASE EXCESS (ARTERIAL) 0.1 -2.0 - 2.0 mmol/L    MET-HEMOGLOBIN 0.2 <=2.0 %    LACTATE 0.8 <=2.0 mmol/L    CARBOXYHEMOGLOBIN 4.8 (H) <=1.5 %    O2CT 18.0 %    %FIO2 (ARTERIAL) 21 %    PO2 (ARTERIAL) 75 (L) 80 - 100 mm/Hg    OXYHEMOGLOBIN 91.4 88.0 - 100.0 %    ALLEN TEST yes     DRAW SITE LB    AMMONIA   Result Value Ref Range    AMMONIA 29  16 - 53 umol/L   B-TYPE NATRIURETIC PEPTIDE   Result Value Ref Range    BNP <25 5 - 100 pg/mL    Narrative                                Class 1: 101-250 pg/mL                              Class 2: 251-550 pg/mL                              Class 3: 551-900 pg/mL                              Class 4: >901 pg/mL     The [place] [employer] has developed a four-stage functional classification system for CHF that is based on a subjective interpretation of the severity of a patient's clinical signs and symptoms.    Class 1 - Patients have no limitations on physical activity and have no symptoms with ordinary physical  activity.    Class 2 - Patients have a slight limitation of physical activity and have symptoms with ordinary physical activity.    Class 3 - Patients have a marked limitation of physical activity and have symptoms with less than ordinary physical activity, but not at rest.    Class 4 - Patients are unable to perform any physical activity without discomfort.   ETHANOL, SERUM/PLASMA   Result Value Ref Range    ETHANOL <10 (H) 0 mg/dL   CBC/DIFF    Narrative    The following orders were created for panel order CBC/DIFF.  Procedure                               Abnormality         Status                     ---------                               -----------         ------                     CBC WITH ZESP[[id number]]                Normal              Final result                 Please view results for these tests on the individual orders.   COMPREHENSIVE METABOLIC PANEL, NON-FASTING   Result Value Ref Range    SODIUM 140 136 - 145 mmol/L    POTASSIUM 3.4 (L) 3.5 - 5.1 mmol/L    CHLORIDE 109 (H) 98 - 107 mmol/L    CO2 TOTAL 24 21 - 31 mmol/L    ANION GAP 7 4 - 13 mmol/L    BUN 12 7 - 25 mg/dL    CREATININE 0.57 (L) 0.60 - 1.30 mg/dL    BUN/CREA RATIO 21 6 - 22    ESTIMATED GFR 125 >59 mL/min/1.61m2  ALBUMIN 4.3 3.5 - 5.7 g/dL    CALCIUM 8.9 8.6 - 10.3 mg/dL    GLUCOSE 106 74 - 109 mg/dL    ALKALINE PHOSPHATASE 83 34 - 104 U/L    ALT (SGPT) 12 7 - 52 U/L    AST (SGOT) 19 13 - 39 U/L    BILIRUBIN TOTAL 0.4 0.3 - 1.2 mg/dL    PROTEIN TOTAL 7.4 6.4 - 8.9 g/dL    ALBUMIN/GLOBULIN RATIO 1.4 0.8 - 1.4    OSMOLALITY, CALCULATED 280 270 - 290 mOsm/kg    CALCIUM, CORRECTED 8.6 (L) 8.9 - 10.8 mg/dL    GLOBULIN 3.1 2.9 - 5.4    Narrative    Estimated Glomerular Filtration Rate (eGFR) is calculated using the CKD-EPI (2021) equation, intended for patients 15 years of age and older. If gender is not documented or "unknown", there will be no eGFR calculation.   COVID-19, FLU A/B, RSV RAPID BY PCR   Result Value Ref Range     SARS-CoV-2 Not Detected Not Detected    INFLUENZA VIRUS TYPE A Not Detected Not Detected    INFLUENZA VIRUS TYPE B Not Detected Not Detected    RESPIRATORY SYNCTIAL VIRUS (RSV) Not Detected Not Detected    Narrative    Results are for the simultaneous qualitative identification of SARS-CoV-2 (formerly 2019-nCoV), Influenza A, Influenza B, and RSV RNA. These etiologic agents are generally detectable in nasopharyngeal and nasal swabs during the ACUTE PHASE of infection. Hence, this test is intended to be performed on respiratory specimens collected from individuals with signs and symptoms of upper respiratory tract infection who meet Centers for Disease Control and Prevention (CDC) clinical and/or epidemiological criteria for Coronavirus Disease 2019 (COVID-19) testing. CDC COVID-19 criteria for testing on human specimens is available at [facility] webpage information for Healthcare Professionals: Coronavirus Disease 2019 (COVID-19) ([email or web address]).     False-negative results may occur if the virus has genomic mutations, insertions, deletions, or rearrangements or if performed very early in the course of illness. Otherwise, negative results indicate virus specific RNA targets are not detected, however negative results do not preclude SARS-CoV-2 infection/COVID-19, Influenza, or Respiratory syncytial virus infection. Results should not be used as the sole basis for patient management decisions. Negative results must be combined with clinical observations, patient history, and epidemiological information. If upper respiratory tract infection is still suspected based on exposure history together with other clinical findings, re-testing should be considered.    Disclaimer:   This assay has been authorized by FDA under an Emergency Use Authorization for use in laboratories certified under the Clinical Laboratory Improvement Amendments of 1988 (CLIA), 42 U.S.C. [phone], to perform high  complexity tests. The impacts of vaccines, antiviral therapeutics, antibiotics, chemotherapeutic or immunosuppressant drugs have not been evaluated.     Test methodology:   Cepheid Xpert Xpress SARS-CoV-2/Flu/RSV Assay real-time polymerase chain reaction (RT-PCR) test on the GeneXpert Dx and Xpert Xpress systems.   C-REACTIVE PROTEIN (CRP)   Result Value Ref Range    C-REACTIVE PROTEIN (CRP) 0.1 0.1 - 0.5 mg/dL   CREATINE KINASE (CK), TOTAL, SERUM   Result Value Ref Range    CREATINE KINASE 168 30 - 223 U/L   URINALYSIS, MACROSCOPIC   Result Value Ref Range    COLOR Yellow Colorless, Light Yellow, Yellow    APPEARANCE Clear Clear    SPECIFIC GRAVITY 1.004 1.002 - 1.030    PH 6.0 5.0 - 9.0    LEUKOCYTES Negative Negative, 100  WBCs/uL    NITRITE Negative  Negative    PROTEIN Negative Negative, 10 , 20  mg/dL    GLUCOSE Negative Negative, 30  mg/dL    KETONES Negative Negative, Trace mg/dL    BILIRUBIN Negative Negative, 0.5 mg/dL    BLOOD Negative Negative, 0.03 mg/dL    UROBILINOGEN Normal Normal mg/dL   URINALYSIS, MICROSCOPIC   Result Value Ref Range    BACTERIA Moderate (A) Negative /hpf    RBCS      WBCS <1 <6 /hpf   CBC WITH DIFF   Result Value Ref Range    WBC 9.6 3.8 - 11.8 x10^3/uL    RBC 4.50 3.63 - 4.92 x10^6/uL    HGB 13.4 10.9 - 14.3 g/dL    HCT 38.3 31.2 - 41.9 %    MCV 85.1 75.5 - 95.3 fL    MCH 29.8 24.7 - 32.8 pg    MCHC 35.0 32.3 - 35.6 g/dL    RDW 14.2 12.3 - 17.7 %    PLATELETS 223 140 - 440 x10^3/uL    MPV 8.8 7.9 - 10.8 fL    NEUTROPHIL % 76 43 - 77 %    LYMPHOCYTE % 16 16 - 44 %    MONOCYTE % 6 5 - 13 %    EOSINOPHIL % 1 %    BASOPHIL % 1 0 - 1 %    NEUTROPHIL # 7.30 1.85 - 7.80 x10^3/uL    LYMPHOCYTE # 1.60 1.00 - 3.00 x10^3/uL    MONOCYTE # 0.60 0.30 - 1.00 x10^3/uL    EOSINOPHIL # 0.10 0.00 - 0.50 x10^3/uL    BASOPHIL # 0.00 0.00 - 0.10 x10^3/uL   HCG QUALITATIVE PREGNANCY, SERUM   Result Value Ref Range    PREGNANCY, SERUM QUALITATIVE Negative Negative, Indeterminate   THYROXINE, FREE (FREE  T4)   Result Value Ref Range    THYROXINE (T4), FREE 0.97 0.58 - 1.64 ng/dL       Diagnostic Imaging:  CT BRAIN WO IV CONTRAST   Final Result   NO ACUTE FINDINGS         One or more dose reduction techniques were used (e.g., Automated exposure control, adjustment of the mA and/or kV according to patient size, use of iterative reconstruction technique).         Radiologist location ID: [id number]         XR AP MOBILE CHEST   Final Result   NO ACUTE FINDINGS.         Radiologist location ID: [id number]              Diagnostic Cardiology: EKG      Assesment/Plan:  Patient Active Problem List   Diagnosis    Toxic encephalopathy    Drug overdose of undetermined intent      Will give normal saline 150 mL/hr   Repeat labs in a.m.   Monitor closely in ICU   Will determine if patient needs further evaluation once more alert   Home medications will be restarted as appropriate.  Further orders and recommendations will be based on clinical course and progress.  Dr. Gilford Silvius present, examined patient and agrees with above documentation and treatment plan.   DVT Prophylaxis: lovenox  Diet: npo    CODE:     On the day of the encounter, a total of  35 minutes was spent on this patient encounter including review of historical information, examination, documentation and post-visit activities. The time documented excludes procedural time.      Vauda Salvucci,  FNP-BC

## 2022-03-23 NOTE — ED Nurses Note (Signed)
Patient began yelling at attempting to get out of bed. Geodon was given per [facility].

## 2022-03-23 NOTE — ED Triage Notes (Signed)
EMS CALLED FOR ALTERED MENTAL STATUS AND VOMITING. CALLER STATES She MAY BE WITHDRAWING FROM [facility]. PT IS RESISTANT TO CARE. NO IV,

## 2022-03-23 NOTE — ED Nurses Note (Addendum)
Patient to room 13 from [place]. PT C/O possible overdose. Pt unresponsive to shaking or sternal rub. Nasal narcan administered with no response. PT hooked to pulse oximetry and blood pressure. PT then sat up and started thrashing and punching at staff. PT was also hitting head head on bedside rails. Pt was medicated per MAR with no resolution. Provider notified. Additional medications added to [facility] and given by nurse. PT calm at this time and hooked to cardiac monitoring and blood pressure cuff. Oxygen saturation at this time is 96% with a blood pressure of 136/90 and HR of 96. Treatment plan started.

## 2022-03-23 NOTE — ED Provider Notes (Signed)
[facility]  Emergency Department  Attending Provider Note      CHIEF COMPLAINT  Chief Complaint   Patient presents with    Altered Mental Status     HISTORY OF PRESENT ILLNESS  Jasmine Farmer, date of birth January 16, 1991, is a 31 y.o. female who presented to the Emergency Department.    PATIENT PRESENTS EMERGENCY DEPARTMENT TODAY AS A REPORTED OVERDOSE.  AT THE TIME OF THE INITIAL INTERVIEW THE PATIENT WAS NOT RESPONDING TO QUESTIONS.  BUT THEN THE PATIENT BECAME HYPER AROUSED AND WAS TRYING TO CLIMB OUT OF BED AND THRASHING.  NOTHING REPORTED BETTER EXACERBATE THE MODERATE TO SEVERE CONDITION.  THE PATIENT WAS GIVEN GEODON AND ATIVAN WHICH DID NOT CONTROL HER SYMPTOMS.  SHE WAS GIVEN A SHOT OF IM KETAMINE WHICH DID HELP CALM HER DOWN.  NO OTHER HISTORY IS AVAILABLE AT THIS TIME.  EMS PROVIDED WHAT HISTORY WE HAVE.    PAST MEDICAL/SURGICAL/FAMILY/SOCIAL HISTORY  No past medical history on file.      Family Medical History:    None       Social History     Socioeconomic History    Marital status: Single      ALLERGIES  No Known Allergies    PHYSICAL EXAM  VITAL SIGNS:  Filed Vitals:    03/23/22 1615 03/23/22 1630 03/23/22 1645 03/23/22 1700   BP: 107/73 110/80 116/78 117/86   Pulse: 70 69 69 66   Resp: (!) _0 (!) 24   Temp:       SpO2: 95% 96% 97% 98%     GENERAL: PATIENT IS NOT ALERT AND NOT ORIENTED TO PERSON, PLACE, AND TIME.  HEAD: NORMOCEPHALIC AND ATRAUMATIC.  EYES: PUPILS EQUALLY ROUND AND REACT TO LIGHT. EXTRAOCULAR MOVEMENTS INTACT.  EARS: GROSS HEARING INTACT. EXTERNAL EARS WITHIN NORMAL LIMITS.  NOSE: NO SEPTAL DEVIATION. NASAL PASSAGES CLEAR.  THROAT: MOIST ORAL MUCOSA.   NECK: SUPPLE. TRACHEA MIDLINE.  HEART: REGULAR, RATE, AND RHYTHM.  LUNGS: CLEAR TO AUSCULTATION BILATERAL.  ABDOMEN: SOFT, NON-TENDER, NON-DISTENDED, AND BOWEL SOUNDS ARE PRESENT.  GENITOURINARY: DEFERRED.  RECTAL: DEFERRED.  EXTREMITIES: NO CYANOSIS, CLUBBING, OR EDEMA.  SKIN: WARM AND DRY.  MUSCULOSKELETAL:  DEFERRED.  NEUROLOGIC: CRANIAL NERVES II THROUGH XII ARE GROSSLY INTACT. SENSATION TO LIGHT TOUCH IS INTACT.  PSYCHIATRIC: JUDGMENT AND INSIGHT ARE LACKING. MOOD AGITATED AND AGGRESSIVE AND AFFECT IS BROAD FOR THE SITUATION.      DIAGNOSTICS  Labs:  Labs listed below were reviewed and interpreted by me.  Results for orders placed or performed during the hospital encounter of 03/23/22   URINE DRUG SCREEN   Result Value Ref Range    AMPHET QL Negative Negative    BARB QL Negative Negative    BENZO QL Negative Negative    COCQL Negative Negative    OPIATE Negative Negative    PCP QL Negative Negative    CANNAQL Negative Negative    BUP QL Negative Negative    OXYCODONE URINE Negative Negative    FENTANYL, RANDOM URINE Positive (A) Negative   TROPONIN-I   Result Value Ref Range    TROPONIN I 3 <15 ng/L   THYROID STIMULATING HORMONE WITH FREE T4 REFLEX   Result Value Ref Range    TSH 0.230 (L) 0.450 - 5.330 uIU/mL   LACTIC ACID LEVEL W/ REFLEX FOR LEVEL >2.0   Result Value Ref Range    LACTIC ACID 0.7 0.5 - 2.2 mmol/L   LIPASE   Result Value Ref  Range    LIPASE 14 11 - 82 U/L   MAGNESIUM   Result Value Ref Range    MAGNESIUM 1.9 1.9 - 2.7 mg/dL   PT/INR   Result Value Ref Range    PROTHROMBIN TIME 12.4 9.8 - 12.7 seconds    INR 1.07 <=5.00   PTT (PARTIAL THROMBOPLASTIN TIME)   Result Value Ref Range    APTT 33.3 26.0 - 36.0 seconds   ARTERIAL BLOOD GAS/LACTATE   Result Value Ref Range    PH (ARTERIAL) 7.42 7.35 - 7.45    PCO2 (ARTERIAL) 38 35 - 45 mm/Hg    BICARBONATE (ARTERIAL) 24.6 20.0 - 26.0 mmol/L    BASE EXCESS (ARTERIAL) 0.1 -2.0 - 2.0 mmol/L    MET-HEMOGLOBIN 0.2 <=2.0 %    LACTATE 0.8 <=2.0 mmol/L    CARBOXYHEMOGLOBIN 4.8 (H) <=1.5 %    O2CT 18.0 %    %FIO2 (ARTERIAL) 21 %    PO2 (ARTERIAL) 75 (L) 80 - 100 mm/Hg    OXYHEMOGLOBIN 91.4 88.0 - 100.0 %    ALLEN TEST yes     DRAW SITE LB    AMMONIA   Result Value Ref Range    AMMONIA 29 16 - 53 umol/L   B-TYPE NATRIURETIC PEPTIDE   Result Value Ref Range    BNP <25 5  - 100 pg/mL   ETHANOL, SERUM/PLASMA   Result Value Ref Range    ETHANOL <10 (H) 0 mg/dL   COMPREHENSIVE METABOLIC PANEL, NON-FASTING   Result Value Ref Range    SODIUM 140 136 - 145 mmol/L    POTASSIUM 3.4 (L) 3.5 - 5.1 mmol/L    CHLORIDE 109 (H) 98 - 107 mmol/L    CO2 TOTAL 24 21 - 31 mmol/L    ANION GAP 7 4 - 13 mmol/L    BUN 12 7 - 25 mg/dL    CREATININE 0.57 (L) 0.60 - 1.30 mg/dL    BUN/CREA RATIO 21 6 - 22    ESTIMATED GFR 125 >59 mL/min/1.48m2    ALBUMIN 4.3 3.5 - 5.7 g/dL    CALCIUM 8.9 8.6 - 10.3 mg/dL    GLUCOSE 106 74 - 109 mg/dL    ALKALINE PHOSPHATASE 83 34 - 104 U/L    ALT (SGPT) 12 7 - 52 U/L    AST (SGOT) 19 13 - 39 U/L    BILIRUBIN TOTAL 0.4 0.3 - 1.2 mg/dL    PROTEIN TOTAL 7.4 6.4 - 8.9 g/dL    ALBUMIN/GLOBULIN RATIO 1.4 0.8 - 1.4    OSMOLALITY, CALCULATED 280 270 - 290 mOsm/kg    CALCIUM, CORRECTED 8.6 (L) 8.9 - 10.8 mg/dL    GLOBULIN 3.1 2.9 - 5.4   COVID-19, FLU A/B, RSV RAPID BY PCR   Result Value Ref Range    SARS-CoV-2 Not Detected Not Detected    INFLUENZA VIRUS TYPE A Not Detected Not Detected    INFLUENZA VIRUS TYPE B Not Detected Not Detected    RESPIRATORY SYNCTIAL VIRUS (RSV) Not Detected Not Detected   C-REACTIVE PROTEIN (CRP)   Result Value Ref Range    C-REACTIVE PROTEIN (CRP) 0.1 0.1 - 0.5 mg/dL   CREATINE KINASE (CK), TOTAL, SERUM   Result Value Ref Range    CREATINE KINASE 168 30 - 223 U/L   URINALYSIS, MACROSCOPIC   Result Value Ref Range    COLOR Yellow Colorless, Light Yellow, Yellow    APPEARANCE Clear Clear    SPECIFIC GRAVITY 1.004 1.002 - 1.030  PH 6.0 5.0 - 9.0    LEUKOCYTES Negative Negative, 100  WBCs/uL    NITRITE Negative Negative    PROTEIN Negative Negative, 10 , 20  mg/dL    GLUCOSE Negative Negative, 30  mg/dL    KETONES Negative Negative, Trace mg/dL    BILIRUBIN Negative Negative, 0.5 mg/dL    BLOOD Negative Negative, 0.03 mg/dL    UROBILINOGEN Normal Normal mg/dL   URINALYSIS, MICROSCOPIC   Result Value Ref Range    BACTERIA Moderate (A) Negative /hpf    RBCS       WBCS <1 <6 /hpf   CBC WITH DIFF   Result Value Ref Range    WBC 9.6 3.8 - 11.8 x10^3/uL    RBC 4.50 3.63 - 4.92 x10^6/uL    HGB 13.4 10.9 - 14.3 g/dL    HCT 38.3 31.2 - 41.9 %    MCV 85.1 75.5 - 95.3 fL    MCH 29.8 24.7 - 32.8 pg    MCHC 35.0 32.3 - 35.6 g/dL    RDW 14.2 12.3 - 17.7 %    PLATELETS 223 140 - 440 x10^3/uL    MPV 8.8 7.9 - 10.8 fL    NEUTROPHIL % 76 43 - 77 %    LYMPHOCYTE % 16 16 - 44 %    MONOCYTE % 6 5 - 13 %    EOSINOPHIL % 1 %    BASOPHIL % 1 0 - 1 %    NEUTROPHIL # 7.30 1.85 - 7.80 x10^3/uL    LYMPHOCYTE # 1.60 1.00 - 3.00 x10^3/uL    MONOCYTE # 0.60 0.30 - 1.00 x10^3/uL    EOSINOPHIL # 0.10 0.00 - 0.50 x10^3/uL    BASOPHIL # 0.00 0.00 - 0.10 x10^3/uL   HCG QUALITATIVE PREGNANCY, SERUM   Result Value Ref Range    PREGNANCY, SERUM QUALITATIVE Negative Negative, Indeterminate   THYROXINE, FREE (FREE T4)   Result Value Ref Range    THYROXINE (T4), FREE 0.97 0.58 - 1.64 ng/dL   ECG 12 LEAD   Result Value Ref Range    Ventricular rate 61 BPM    Atrial Rate 61 BPM    PR Interval 112 ms    QRS Duration 82 ms    QT Interval 432 ms    QTC Calculation 434 ms    Calculated P Axis 6 degrees    Calculated R Axis 70 degrees    Calculated T Axis 75 degrees     Radiology:  Results for orders placed or performed during the hospital encounter of 03/23/22   XR AP MOBILE CHEST     Status: None    Narrative    [facility] [place]    RADIOLOGIST: Dorisann Frames, MD    XR AP MOBILE CHEST performed on 03/23/2022 2:24 PM    CLINICAL HISTORY: FEVER.  sob, AMS    TECHNIQUE: Frontal view of the chest.    COMPARISON:  03/23/2022    FINDINGS:    The heart size is normal.   The lungs are clear.           Impression    NO ACUTE FINDINGS.      Radiologist location ID: [id number]     CT BRAIN WO IV CONTRAST     Status: None    Narrative    [facility] [place]    RADIOLOGIST: Dara Lords, MD    CT BRAIN WO IV CONTRAST performed on 03/23/2022 4:03 PM    CLINICAL  HISTORY: ams.  ams    TECHNIQUE:  Head CT without  intravenous contrast.    COMPARISON: None.  # of known CTs in the past 12 months:  0   # of known Cardiac Nuclear Medicine Studies in the past 12 months:  0    FINDINGS:  There is no acute intracranial hemorrhage, mass effect, or evidence of large acute infarct.    Brain: Normal    CSF Spaces: Normal     Sinuses/Mastoids:  Clear at visualized levels     Bones: Unremarkable        Impression    NO ACUTE FINDINGS      One or more dose reduction techniques were used (e.g., Automated exposure control, adjustment of the mA and/or kV according to patient size, use of iterative reconstruction technique).      Radiologist location ID: [id number]         ED COURSE/MEDICAL DECISION MAKING  Medications Administered in the ED   NS premix infusion (has no administration in time range)   NS bolus infusion 2,000 mL (0 mL Intravenous Stopped 03/23/22 1706)   naloxone (NARCAN) 1 mg/mL injection (2 mg INTRANASAL Given 03/23/22 1240)   LORazepam (ATIVAN) 2 mg/mL injection (2 mg IntraMUSCULAR Given 03/23/22 1334)   LORazepam (ATIVAN) 2 mg/mL injection (2 mg IntraMUSCULAR Given 03/23/22 1334)      ED Course as of 03/23/22 1821   Mon Mar 23, 2022   1626 AMMONIA: 29  NORMAL   1626 CREATINE KINASE (CK): 168  NORMAL   1626 B-TYPE NATRIURETIC PEPTIDE: <25  NORMAL   1626 C-REACTIVE PROTEIN (CRP): 0.1  NORMAL   1627 XR AP MOBILE CHEST  TODAY I,  A. , D.O., PERSONALLY VISUALIZED THE PATIENT'S DIAGNOSTIC IMAGING STUDIES.  DEFER TO THE RADIOLOGIST'S REPORT FOR THE FINAL DEFINITIVE RADIOLOGY READING.  MY INITIAL INDEPENDENT INTERPRETATION IS AS FOLLOWS, BUT NOT LIMITED TO:  CHRONIC CHANGES   1627 ECG 12 LEAD  TODAY I,  A. , D.O., PERSONALLY VISUALIZED THE PATIENT'S EKG TRACING.  I AM THE PRIMARY INTERPRETER.  NORMAL SINUS RHYTHM AT A RATE OF 61.  NO AV BLOCKS.  NORMAL AXIS.  BASELINE WANDERING WITH ARTIFACT.  NONSPECIFIC ST-T CHANGES.  NO LVH.  NO BUNDLE-BRANCH BLOCKS.      Medical Decision Making  Problems  Addressed:  Acute metabolic encephalopathy: acute illness or injury  Combative behavior: acute illness or injury  Drug abuse and dependence (CMS [place]): acute illness or injury    Amount and/or Complexity of Data Reviewed  Labs: ordered. Decision-making details documented in ED Course.  Radiology: ordered and independent interpretation performed. Decision-making details documented in ED Course.  ECG/medicine tests: ordered and independent interpretation performed. Decision-making details documented in ED Course.    Risk  Prescription drug management.  Parenteral controlled substances.  Drug therapy requiring intensive monitoring for toxicity.  Decision regarding hospitalization.  Diagnosis or treatment significantly limited by social determinants of health.    Critical Care  Total time providing critical care: 35 minutes      CLINICAL IMPRESSION  Clinical Impression   Acute metabolic encephalopathy (Primary)   Drug abuse and dependence (CMS HCC)   Combative behavior     DISPOSITION  Admitted       DISCHARGE MEDICATIONS  There are no discharge medications for this patient.      / A. Rosana Hoes, DO, MBA   03/23/2022, 17:41   [facility]  Department of Emergency Medicine  [facility]  This note was partially generated using MModal Fluency Direct system, and there may be some incorrect words, spellings, and punctuation that were not noted in checking the note before saving.

## 2022-03-24 DIAGNOSIS — T40411A Poisoning by fentanyl or fentanyl analogs, accidental (unintentional), initial encounter (CMS HCC): Secondary | ICD-10-CM

## 2022-03-24 DIAGNOSIS — G929 Unspecified toxic encephalopathy: Secondary | ICD-10-CM

## 2022-03-24 DIAGNOSIS — F1920 Other psychoactive substance dependence, uncomplicated: Secondary | ICD-10-CM

## 2022-03-24 LAB — CBC WITH DIFF
BASOPHIL #: 0.1 10*3/uL (ref 0.00–0.10)
BASOPHIL %: 1 % (ref 0–1)
EOSINOPHIL #: 0.1 10*3/uL (ref 0.00–0.50)
EOSINOPHIL %: 1 %
HCT: 41.4 % (ref 31.2–41.9)
HGB: 14 g/dL (ref 10.9–14.3)
LYMPHOCYTE #: 2.9 10*3/uL (ref 1.00–3.00)
LYMPHOCYTE %: 29 % (ref 16–44)
MCH: 29.1 pg (ref 24.7–32.8)
MCHC: 33.9 g/dL (ref 32.3–35.6)
MCV: 85.8 fL (ref 75.5–95.3)
MONOCYTE #: 0.7 10*3/uL (ref 0.30–1.00)
MONOCYTE %: 7 % (ref 5–13)
MPV: 8.9 fL (ref 7.9–10.8)
NEUTROPHIL #: 6.1 10*3/uL (ref 1.85–7.80)
NEUTROPHIL %: 62 % (ref 43–77)
PLATELETS: 247 10*3/uL (ref 140–440)
RBC: 4.83 10*6/uL (ref 3.63–4.92)
RDW: 14.4 % (ref 12.3–17.7)
WBC: 9.8 10*3/uL (ref 3.8–11.8)

## 2022-03-24 LAB — BASIC METABOLIC PANEL
ANION GAP: 10 mmol/L (ref 4–13)
ANION GAP: 9 mmol/L (ref 4–13)
BUN/CREA RATIO: 21 (ref 6–22)
BUN/CREA RATIO: 19 (ref 6–22)
BUN: 12 mg/dL (ref 7–25)
BUN: 13 mg/dL (ref 7–25)
CALCIUM: 8.8 mg/dL (ref 8.6–10.3)
CALCIUM: 9.3 mg/dL (ref 8.6–10.3)
CHLORIDE: 110 mmol/L — ABNORMAL HIGH (ref 98–107)
CHLORIDE: 114 mmol/L — ABNORMAL HIGH (ref 98–107)
CO2 TOTAL: 22 mmol/L (ref 21–31)
CO2 TOTAL: 22 mmol/L (ref 21–31)
CREATININE: 0.56 mg/dL — ABNORMAL LOW (ref 0.60–1.30)
CREATININE: 0.67 mg/dL (ref 0.60–1.30)
ESTIMATED GFR: 126 mL/min/{1.73_m2} (ref >59)
ESTIMATED GFR: 121 mL/min/{1.73_m2} (ref >59)
GLUCOSE: 109 mg/dL (ref 74–109)
GLUCOSE: 112 mg/dL — ABNORMAL HIGH (ref 74–109)
OSMOLALITY, CALCULATED: 284 mOsm/kg (ref 270–290)
OSMOLALITY, CALCULATED: 290 mOsm/kg (ref 270–290)
POTASSIUM: 3.4 mmol/L — ABNORMAL LOW (ref 3.5–5.1)
POTASSIUM: 3.9 mmol/L (ref 3.5–5.1)
SODIUM: 142 mmol/L (ref 136–145)
SODIUM: 145 mmol/L (ref 136–145)

## 2022-03-24 LAB — ECG 12 LEAD
Atrial Rate: 61 {beats}/min
Calculated P Axis: 6 degrees
Calculated R Axis: 70 degrees
Calculated T Axis: 75 degrees
PR Interval: 112 ms
QRS Duration: 82 ms
QT Interval: 432 ms
QTC Calculation: 434 ms
Ventricular rate: 61 {beats}/min

## 2022-03-24 LAB — MAGNESIUM: MAGNESIUM: 2.1 mg/dL (ref 1.9–2.7)

## 2022-03-24 LAB — ETHANOL, SERUM/PLASMA: ETHANOL: <10 mg/dL — ABNORMAL HIGH

## 2022-03-24 MED ORDER — ZIPRASIDONE 20 MG/ML (FINAL CONCENTRATION) INTRAMUSCULAR SOLUTION
INTRAMUSCULAR | Status: AC
Start: 2022-03-24 — End: 2022-03-24
  Filled 2022-03-24: qty 1

## 2022-03-24 MED ORDER — ALUMINUM-MAG HYDROXIDE-SIMETHICONE 200 MG-200 MG-20 MG/5 ML ORAL SUSP
30.0000 mL | ORAL | Status: DC | PRN
Start: 2022-03-24 — End: 2022-03-24

## 2022-03-24 MED ORDER — MAGNESIUM HYDROXIDE 400 MG/5 ML ORAL SUSPENSION
30.0000 mL | Freq: Every evening | ORAL | Status: DC | PRN
Start: 2022-03-24 — End: 2022-03-24

## 2022-03-24 MED ORDER — LORAZEPAM 2 MG/ML INJECTION WRAPPER
1.0000 mg | INTRAMUSCULAR | Status: DC | PRN
Start: 2022-03-24 — End: 2022-03-24

## 2022-03-24 MED ORDER — LORAZEPAM 2 MG/ML INJECTION SYRINGE
INJECTION | INTRAMUSCULAR | Status: AC
Start: 2022-03-24 — End: 2022-03-24
  Filled 2022-03-24: qty 1

## 2022-03-24 MED ORDER — ACETAMINOPHEN 325 MG TABLET
650.0000 mg | ORAL_TABLET | ORAL | Status: DC | PRN
Start: 2022-03-24 — End: 2022-03-24

## 2022-03-24 MED ORDER — LORAZEPAM 2 MG/ML INJECTION WRAPPER
1.0000 mg | INTRAMUSCULAR | Status: DC | PRN
Start: 2022-03-24 — End: 2022-03-24
  Administered 2022-03-24: 1 mg via INTRAMUSCULAR

## 2022-03-24 MED ORDER — TRAZODONE 50 MG TABLET
50.0000 mg | ORAL_TABLET | Freq: Every evening | ORAL | Status: DC | PRN
Start: 2022-03-24 — End: 2022-03-24

## 2022-03-24 NOTE — Consults (Signed)
Ms. Hennigh is a 31 year old female who presented to the emergency department on 03/23/2022 for altered mental status and severe agitation.  The patient's drug screen was positive for fentanyl.  According to ER documentation the patient's presentation appeared to be more consistent with methamphetamine abuse however the urine drug screen was only positive for fentanyl.  The patient was requiring IM Ativan and Geodon to help with severe agitation.  She also received a dose of IM ketamine. Patient also received Narcan and IV fluids.    Patient was diagnosed with acute metabolic encephalopathy and drug abuse and dependence.  She has remained in the ER while awaiting bed assignment in  ICU for further observation.  The patient does not appear to to have any  medical or surgical history.   Ammonia was 29, CK was 168, sodium 142, initial potassium 3.4 repeat was 3.9, TSH 0.230, urine pregnancy was negative, head CT showed no acute findings.  There have been no reports of any suicidal or homicidal ideation, no acute psychosis.  According to ER documentation the patient has been agitated and has been wanting to leave.  I spoke with the patient's primary nurse Pam by phone who reports that she was initially uncooperative with lab draw this a.m. but then agreed.  There has been no indication that the patient is suicidal or homicidal according to primary nurse.  The patient does not meet criteria for admission to the [facility] at this point.  We recommend continuing to utilize  as needed Geodon and Ativan for agitation for now.  It is unclear at this time whether the patient is going to remain in the hospital for continued treatment.  ER can file commitment if they feel the patient is a threat to herself or others.  Outpatient follow-up at [facility] for substance abuse treatment is also an option.  Please do not hesitate to call for any new psychiatric issues or concerns.  Thank you for this consult. Dr. Diona Browner consulted  as well and agrees with current plan.

## 2022-03-25 ENCOUNTER — Other Ambulatory Visit: Payer: Self-pay

## 2022-03-25 ENCOUNTER — Encounter (HOSPITAL_BASED_OUTPATIENT_CLINIC_OR_DEPARTMENT_OTHER): Payer: Self-pay

## 2022-03-25 ENCOUNTER — Emergency Department (HOSPITAL_BASED_OUTPATIENT_CLINIC_OR_DEPARTMENT_OTHER): Payer: Medicaid Other

## 2022-03-25 ENCOUNTER — Emergency Department
Admission: EM | Admit: 2022-03-25 | Discharge: 2022-03-26 | Disposition: A | Discharge status: Other Acute Inpatient Hospital | Payer: Medicaid Other | Attending: Family | Admitting: Family

## 2022-03-25 DIAGNOSIS — M542 Cervicalgia: Secondary | ICD-10-CM

## 2022-03-25 DIAGNOSIS — F1910 Other psychoactive substance abuse, uncomplicated: Secondary | ICD-10-CM

## 2022-03-25 DIAGNOSIS — F411 Generalized anxiety disorder: Secondary | ICD-10-CM

## 2022-03-25 HISTORY — DX: Other psychoactive substance abuse, uncomplicated (CMS HCC): F19.10

## 2022-03-25 LAB — METHADONE, RANDOM URINE: METHADONE URINE: NEGATIVE

## 2022-03-25 MED ORDER — LIDOCAINE 4 % TOPICAL PATCH
MEDICATED_PATCH | CUTANEOUS | Status: AC
Start: 2022-03-25 — End: 2022-03-25
  Filled 2022-03-25: qty 1

## 2022-03-25 MED ORDER — KETOROLAC 60 MG/2 ML INTRAMUSCULAR SOLUTION
60.0000 mg | INTRAMUSCULAR | Status: AC
Start: 2022-03-25 — End: 2022-03-25
  Administered 2022-03-25: 60 mg via INTRAMUSCULAR

## 2022-03-25 MED ORDER — LIDOCAINE 4 % TOPICAL PATCH
1.0000 | MEDICATED_PATCH | CUTANEOUS | Status: DC
Start: 2022-03-25 — End: 2022-03-26
  Administered 2022-03-25: 1 via TRANSDERMAL

## 2022-03-25 MED ORDER — KETOROLAC 60 MG/2 ML INTRAMUSCULAR SOLUTION
INTRAMUSCULAR | Status: AC
Start: 2022-03-25 — End: 2022-03-25
  Filled 2022-03-25: qty 2

## 2022-03-25 MED ORDER — ZIPRASIDONE 20 MG/ML (FINAL CONCENTRATION) INTRAMUSCULAR SOLUTION
20.0000 mg | Freq: Once | INTRAMUSCULAR | Status: AC
Start: 2022-03-25 — End: 2022-03-25
  Administered 2022-03-25: 20 mg via INTRAMUSCULAR

## 2022-03-25 MED ORDER — ZIPRASIDONE 20 MG/ML (FINAL CONCENTRATION) INTRAMUSCULAR SOLUTION
INTRAMUSCULAR | Status: AC
Start: 2022-03-25 — End: 2022-03-25
  Filled 2022-03-25: qty 1

## 2022-03-25 NOTE — ED Nurses Note (Addendum)
Received call from [facility] [place] that pt was to be admitted there for substance abuse; pt c/o neck pain. Pt states she popped her neck and now experiences pain with movement. Pt is noted to be moving and thrashing around in bed with no deficits noted. Pt is screaming and cursing at this time. No s/s of resp distress noted at this time. Security at bedside at this time.

## 2022-03-25 NOTE — ED Triage Notes (Signed)
Patient was supposed to be admitted to [facility] for withdrawal and substance abuse treatment.  Patient states while she was at [facility], she "popped her neck" and it hurts to move her head.  Per EMS [facility], patient is frequently dropping things.

## 2022-03-25 NOTE — ED Attending Handoff Note (Signed)
MDM:    ED Course as of 03/25/22 2357   Wed Mar 25, 2022   2356 I did receive sign-out for this patient at approximately 11:00 p.m.Marland Kitchen  She continues to scream out in pain but is neurovascularly intact.  CT of the spine showed no acute abnormality.  I suspect musculoskeletal injury, presentation may be complicated by her drug withdrawal/psychiatric.  I spoke with the patient provided anticipatory guidance.  Patient states that she wants to go back to [facility].  We will facilitate      Discharged  Clinical Impression   Neck pain (Primary)   Anxiety reaction     Medications Administered in the ED   lidocaine 4% patch (1 Patch Transdermal Patch Applied 03/25/22 2301)   ziprasidone (GEODON) IM injection (20 mg IntraMUSCULAR Given 03/25/22 2217)   ketorolac (TORADOL) 60mg /2 mL IM injection (60 mg IntraMUSCULAR Given 03/25/22 2302)        Current Discharge Medication List      You have not been prescribed any medications.

## 2022-03-25 NOTE — Discharge Instructions (Signed)
You were seen emergency department for neck pain.  Your evaluation did not reveal critical condition requiring hospitalization.  Use the contact medications prescribed including Voltaren and lidocaine.  Alternate between Tylenol and ibuprofen for additional pain relief.  Follow with primary care ideally within the next 2-3 days.  Return to emergency department if you have any new or concerning symptoms.  Thank you for visiting [place]

## 2022-03-25 NOTE — ED Nurses Note (Signed)
Provided patient with meal tray and po fluids.

## 2022-03-25 NOTE — ED Provider Notes (Signed)
[facility], [facility] Emergency Department  ED Primary Provider Note  History of Present Illness   Chief Complaint   Patient presents with    Neck Pain     Arrival: The patient arrived by Car  Jasmine Farmer is a 31 y.o. female who had concerns including Neck Pain. Pt states she popped her neck now having bad neck pain. Refused c collar with ems. States she is in rehab for drugs. Crying continuously and stated not to touch her. Denies si hi .     Review of Systems   Constitutional: No fever, chills or weakness   Skin: No rash or diaphoresis  HENT: No headaches, or congestion  Eyes: No vision changes or photophobia   Cardio: No chest pain, palpitations or leg swelling   Respiratory: No cough, wheezing or SOB  GI:  No nausea, vomiting or stool changes  GU:  No dysuria, hematuria, or increased frequency  MSK: No muscle aches, joint +neck pain  Neuro: No seizures, LOC, numbness, tingling, or focal weakness  Psychiatric: No depression, SI or substance abuse  All other systems reviewed and are negative.    Historical Data   History Reviewed This Encounter: all noted and reviewed    Physical Exam   ED Triage Vitals   BP (Non-Invasive) 03/25/22 2154 127/87   Heart Rate 03/25/22 2154 92   Respiratory Rate 03/25/22 2154 18   Temperature 03/25/22 2152 37.2 C (99 F)   SpO2 03/25/22 2154 98 %   Weight 03/25/22 2152 56.7 kg (125 lb)   Height 03/25/22 2152 1.6 m (5\' 3" )     Constitutional:  31 y.o. female who appears in no distress. Normal color, no cyanosis.   HENT:   Head: Normocephalic and atraumatic.   Mouth/Throat: Oropharynx is clear and moist.   Eyes: EOMI, PERRL   Neck: Trachea midline. Neck supple. + paraspinal tenderness   Cardiovascular: RRR, No murmurs, rubs or gallops. Intact distal pulses.  Pulmonary/Chest: BS equal bilaterally. No respiratory distress. No wheezes, rales or chest tenderness.   Abdominal: Bowel sounds present and normal. Abdomen soft, no tenderness, no rebound and  no guarding.  Back: No midline spinal tenderness, no paraspinal tenderness, no CVA tenderness.           Musculoskeletal: No edema, tenderness or deformity.  Skin: warm and dry. No rash, erythema, pallor or cyanosis  Psychiatric: anxious, crying. Pressured speech.   Neurological: Patient keenly alert and responsive, easily able to raise eyebrows, facial muscles/expressions symmetric, speaking in fluent sentences, moving all extremities equally and fully, normal gait  Patient Data   Labs Ordered/Reviewed - No data to display  No orders to display     Medical Decision Making   Diff dx of cervical sprain muscle spasm. Drug withdrawal. Panic disorder. Abscess. Meningitis. fx   2300 care endorsed to dr Nelson Chimes. Ct pending.       Medications Administered in the ED   lidocaine 4% patch (has no administration in time range)   ketorolac (TORADOL) 60mg /2 mL IM injection (has no administration in time range)   ziprasidone (GEODON) IM injection (20 mg IntraMUSCULAR Given 03/25/22 2217)     Clinical Impression   Neck pain (Primary)   Anxiety reaction       Disposition: Discharged

## 2022-03-26 NOTE — ED Nurses Note (Signed)
[facility] here for transport

## 2022-03-26 NOTE — ED Nurses Note (Signed)
Spoke to [employer]; stated to send pt back to [facility] when cleared from ED.

## 2022-03-26 NOTE — ED Nurses Note (Signed)
Pt asleep in room at this time. Respirations even and unlabored. Equal chest rise and fall noted.

## 2022-03-26 NOTE — ED Nurses Note (Signed)
Pt transported by [facility] to [place].

## 2022-03-26 NOTE — ED Nurses Note (Signed)
Pt refused vital sign check.

## 2022-03-26 NOTE — ED Nurses Note (Signed)
Pt still screaming and thrashing around in bed. Pt states "Ya'll don't understand. My fucking neck is hurt." Attempted to calm pt with communication. Attempt unsuccessful.

## 2022-03-28 LAB — ADULT ROUTINE BLOOD CULTURE, SET OF 2 BOTTLES (BACTERIA AND YEAST)
BLOOD CULTURE, ROUTINE: NO GROWTH
BLOOD CULTURE, ROUTINE: NO GROWTH

## 2022-04-01 LAB — URINE CULTURE,ROUTINE: URINE CULTURE: >100000 — AB

## 2023-05-06 ENCOUNTER — Emergency Department
Admission: EM | Admit: 2023-05-06 | Discharge: 2023-05-06 | Disposition: A | Discharge status: Home / Self Care | Payer: Self-pay | Attending: Emergency Medicine | Admitting: Emergency Medicine

## 2023-05-06 ENCOUNTER — Encounter (HOSPITAL_BASED_OUTPATIENT_CLINIC_OR_DEPARTMENT_OTHER): Payer: Self-pay

## 2023-05-06 ENCOUNTER — Other Ambulatory Visit: Payer: Self-pay

## 2023-05-06 DIAGNOSIS — Z3200 Encounter for pregnancy test, result unknown: Secondary | ICD-10-CM | POA: Insufficient documentation

## 2023-05-06 DIAGNOSIS — F1123 Opioid dependence with withdrawal: Secondary | ICD-10-CM | POA: Insufficient documentation

## 2023-05-06 DIAGNOSIS — F1193 Opioid use, unspecified with withdrawal: Secondary | ICD-10-CM

## 2023-05-06 LAB — CBC WITH DIFF
BASOPHIL #: 0.04 10*3/uL (ref 0.00–0.30)
BASOPHIL %: 0 % (ref 0–3)
EOSINOPHIL #: 0.1 10*3/uL (ref 0.00–0.80)
EOSINOPHIL %: 1 % (ref 1–7)
HCT: 48.7 % — ABNORMAL HIGH (ref 37.0–47.0)
HGB: 16.5 g/dL — ABNORMAL HIGH (ref 12.5–16.0)
LYMPHOCYTE #: 2.13 10*3/uL (ref 1.10–5.00)
LYMPHOCYTE %: 18 % — ABNORMAL LOW (ref 25–45)
MCH: 30 pg (ref 27.0–32.0)
MCHC: 33.9 g/dL (ref 32.0–36.0)
MCV: 88.5 fL (ref 78.0–99.0)
MONOCYTE #: 0.84 10*3/uL (ref 0.00–1.30)
MONOCYTE %: 7 % (ref 0–12)
MPV: 8.3 fL (ref 7.4–10.4)
NEUTROPHIL #: 8.69 10*3/uL — ABNORMAL HIGH (ref 1.80–8.40)
NEUTROPHIL %: 74 % (ref 40–76)
PLATELETS: 312 10*3/uL (ref 140–440)
RBC: 5.5 10*6/uL — ABNORMAL HIGH (ref 4.20–5.40)
RDW: 16.2 % — ABNORMAL HIGH (ref 11.6–14.8)
WBC: 11.8 10*3/uL — ABNORMAL HIGH (ref 4.0–10.5)

## 2023-05-06 LAB — COMPREHENSIVE METABOLIC PANEL, NON-FASTING
ALBUMIN/GLOBULIN RATIO: 0.9 (ref 0.8–1.4)
ALBUMIN: 4.3 g/dL (ref 3.4–5.0)
ALKALINE PHOSPHATASE: 103 U/L (ref 46–116)
ALT (SGPT): 21 U/L (ref <=78)
ANION GAP: 14 mmol/L — ABNORMAL HIGH (ref 4–13)
AST (SGOT): 23 U/L (ref 15–37)
BILIRUBIN TOTAL: 0.8 mg/dL (ref 0.2–1.0)
BUN/CREA RATIO: 14
BUN: 11 mg/dL (ref 7–18)
CALCIUM, CORRECTED: 9.4 mg/dL
CALCIUM: 9.6 mg/dL (ref 8.5–10.1)
CHLORIDE: 102 mmol/L (ref 98–107)
CO2 TOTAL: 25 mmol/L (ref 21–32)
CREATININE: 0.8 mg/dL (ref 0.55–1.02)
ESTIMATED GFR: 101 mL/min/{1.73_m2} (ref >59)
GLOBULIN: 4.9
GLUCOSE: 100 mg/dL (ref 74–106)
OSMOLALITY, CALCULATED: 281 mosm/kg (ref 270–290)
POTASSIUM: 3.4 mmol/L — ABNORMAL LOW (ref 3.5–5.1)
PROTEIN TOTAL: 9.2 g/dL — ABNORMAL HIGH (ref 6.4–8.2)
SODIUM: 141 mmol/L (ref 136–145)

## 2023-05-06 LAB — URINALYSIS, MACRO/MICRO
BILIRUBIN: NEGATIVE mg/dL
GLUCOSE: NEGATIVE mg/dL
KETONES: NEGATIVE mg/dL
NITRITE: NEGATIVE
PH: 6.5 (ref 4.6–8.0)
PROTEIN: NEGATIVE mg/dL
SPECIFIC GRAVITY: <=1.005 (ref 1.003–1.035)
UROBILINOGEN: 1 mg/dL (ref 0.2–1.0)

## 2023-05-06 LAB — URINALYSIS, MICROSCOPIC
RBCS: >50 /[HPF] — AB
WBCS: >50 /[HPF] — AB

## 2023-05-06 LAB — SALICYLATE ACID LEVEL: SALICYLATE LEVEL: 7 mg/dL (ref 3–20)

## 2023-05-06 LAB — DRUG SCREEN, NO CONFIRMATION, URINE
AMPHETAMINES URINE: NEGATIVE
BARBITURATES URINE: NEGATIVE
BENZODIAZEPINES URINE: NEGATIVE
CANNABINOIDS URINE: NEGATIVE
COCAINE METABOLITES URINE: NEGATIVE
METHADONE URINE: NEGATIVE
OPIATES URINE: NEGATIVE
PCP URINE: NEGATIVE

## 2023-05-06 LAB — ACETAMINOPHEN LEVEL: ACETAMINOPHEN LEVEL: 0 ug/mL — ABNORMAL LOW (ref 10.0–30.0)

## 2023-05-06 LAB — THYROID STIMULATING HORMONE (SENSITIVE TSH): TSH: 0.302 u[IU]/mL — ABNORMAL LOW (ref 0.358–3.740)

## 2023-05-06 LAB — ETHANOL, SERUM/PLASMA: ETHANOL: <3 mg/dL (ref <=3)

## 2023-05-06 LAB — HCG, SERUM QUALITATIVE, PREGNANCY: PREGNANCY, SERUM QUALITATIVE: NEGATIVE

## 2023-05-06 MED ORDER — CEPHALEXIN 500 MG CAPSULE
500.0000 mg | ORAL_CAPSULE | ORAL | Status: DC
Start: 2023-05-06 — End: 2023-05-06

## 2023-05-06 MED ORDER — IBUPROFEN 800 MG TABLET
800.0000 mg | ORAL_TABLET | ORAL | Status: AC
Start: 2023-05-06 — End: 2023-05-06
  Administered 2023-05-06: 800 mg via ORAL

## 2023-05-06 MED ORDER — IBUPROFEN 800 MG TABLET
ORAL_TABLET | ORAL | Status: AC
Start: 2023-05-06 — End: 2023-05-06
  Filled 2023-05-06: qty 1

## 2023-05-06 NOTE — ED Notes (Signed)
[facility], [facility] - Emergency Department  Peer Recovery Coach Assessment    Initial Evaluation  Referred by:: Nurse  Location of Evaluation: Emergency Department  How many times in the last 12 months have you been to the ED?: 1  Have you ever served or are you currently serving in the Armed Forces?: No             Substance Use History  Patient current substance use status: Patient stated she had been using Fenanyl for past 3 months last use was at 11 pm lat night on 05/05/23.    Prior treatment history?: Yes (Patient stated she has been to six  treatment facilities and only completed one.)  Prior treatment program types: Inpatient, Outpatient  Number of prior treatment admissions: 6 or more    Currently enrolled in substance use program?: No    Within the last 30 days, what substances has the patient used?: Other (patient has used fentanylfor past 3 months.Prior tp that she has been in [facility] clinic for 2and a half years.)  When was the patient's last use of opiates/heroin?: 0-24 hours  Patient's age at first substance use?: 12-15    Has the patient ever had sustained abstinence?: Yes  Length of sustained abstinence.: More than 2 years    Does patient want opiate use treatment?: Yes         Family, Social, Home & Safety History  Marital Status: Single            Need to improve relationships with family?: Yes    Social network: Immediate family, Substance using peers, Non-substance using peers/friends/other    Current living situation: Independent  Any help needed with the following?: None  Contact phone number for the patient: [phone]       Has the patient had any legal issues within the past 30 days?: None         Employment  Current employment status: Employed  Occupation: Banker?: No  Needs assistance with job search?: No    Engagement  Readiness ruler: 3  Summary of assessment priority areas: Substance abuse treatment    Brief  Intervention  Discussed plan to reduce/quit substance use?: Yes  Discussed willingness to enter treatment?: Yes  Indicated patient's stage of change:: 2 - Contemplation    Patient seen by Peer Recovery Coach and is a candidate for buprenorphine administration in the ED. Patient needs assessment for bup treatment.: Yes    Plan  Was the patient referred to treatment?: No    Was patient referred to physician for Buprenorphine Assessment in the ED?: No    Did patient receive Narcan in the ED?: No         Follow-up     Date of intake appointment: 05/06/23  Did patient attend intake appointment?: No  Need for additional follow-up?: Yes       Lanice Shirts, Peer Recovery Coach 05/06/2023 12:45

## 2023-05-06 NOTE — ED Nurses Note (Addendum)
Patient states "[place] is a mother fuckeng joke. I'm going back to [facility]"  as walking by nurses station. Patient walked out main entrance. Patient walked out prior to getting discharge instructions.

## 2023-05-06 NOTE — ED Nurses Note (Signed)
Patient request something for body aches that she rates 8 on pain scale. Dr. Ermalinda Memos made aware. See MAR.

## 2023-05-06 NOTE — ED Triage Notes (Signed)
Patient is here requesting detox from fentanyl, last use 11pm yesterday. Reports daily use.  Reports sweating profusely. Denies any nausea. Denies any SI/HI

## 2023-05-06 NOTE — ED Provider Notes (Signed)
[facility], [facility] - Emergency Department  ED Primary Provider Note  History of Present Illness   Chief Complaint   Patient presents with    Detox Evaluation     Jasmine Farmer is a 32 y.o. female who had concerns including Detox Evaluation.  Arrival: The patient arrived by Car complaining of wanting detox from fentanyl.  Patient's last use was 11:00 a.m. last night.  Patient states she does not use any other drugs or alcohol.  She denies any severe depression.  She denies any suicidal thoughts.  No hallucinations auditory or visual.  Patient denies any medical problems at this time.  Patient denies any alcohol use.    HPI  Review of Systems   Review of Systems   Constitutional:  Positive for activity change and appetite change. Negative for chills and fever.   HENT:  Negative for ear pain and sore throat.    Eyes:  Negative for pain and visual disturbance.   Respiratory:  Negative for cough and shortness of breath.    Cardiovascular:  Negative for chest pain and palpitations.   Gastrointestinal:  Negative for abdominal pain and vomiting.   Genitourinary:  Negative for dysuria and hematuria.   Musculoskeletal:  Negative for arthralgias and back pain.   Skin:  Negative for color change and rash.   Neurological:  Negative for seizures and syncope.   Psychiatric/Behavioral:  Positive for decreased concentration and sleep disturbance. The patient is nervous/anxious and is hyperactive.    All other systems reviewed and are negative.     Historical Data   History Reviewed This Encounter:     Physical Exam   ED Triage Vitals [05/06/23 1043]   BP (Non-Invasive) (!) 103/57   Heart Rate 82   Respiratory Rate 19   Temperature 37.1 C (98.7 F)   SpO2 99 %   Weight 54.4 kg (120 lb)   Height 1.6 m (5\' 3" )     Physical Exam  Vitals and nursing note reviewed.   Constitutional:       General: She is not in acute distress.     Appearance: Normal appearance. She is well-developed and normal weight.   HENT:       Head: Normocephalic and atraumatic.      Right Ear: External ear normal.      Left Ear: External ear normal.      Nose: Nose normal.      Mouth/Throat:      Mouth: Mucous membranes are dry.   Eyes:      Extraocular Movements: Extraocular movements intact.      Conjunctiva/sclera: Conjunctivae normal.      Pupils: Pupils are equal, round, and reactive to light.   Cardiovascular:      Rate and Rhythm: Normal rate and regular rhythm.      Pulses: Normal pulses.      Heart sounds: Normal heart sounds. No murmur heard.  Pulmonary:      Effort: Pulmonary effort is normal. No respiratory distress.      Breath sounds: Normal breath sounds.   Abdominal:      General: Bowel sounds are normal.      Palpations: Abdomen is soft.      Tenderness: There is no abdominal tenderness.   Musculoskeletal:         General: No swelling. Normal range of motion.      Cervical back: Normal range of motion and neck supple.   Skin:     General:  Skin is warm and dry.      Capillary Refill: Capillary refill takes less than 2 seconds.   Neurological:      General: No focal deficit present.      Mental Status: She is alert and oriented to person, place, and time.   Psychiatric:         Mood and Affect: Mood normal.         Behavior: Behavior normal.         Thought Content: Thought content normal.       Patient Data     Labs Ordered/Reviewed   COMPREHENSIVE METABOLIC PANEL, NON-FASTING - Abnormal; Notable for the following components:       Result Value    POTASSIUM 3.4 (*)     ANION GAP 14 (*)     PROTEIN TOTAL 9.2 (*)     All other components within normal limits    Narrative:     Estimated Glomerular Filtration Rate (eGFR) is calculated using the CKD-EPI (2021) equation, intended for patients 35 years of age and older. If gender is not documented or "unknown", there will be no eGFR calculation.   ACETAMINOPHEN LEVEL - Abnormal; Notable for the following components:    ACETAMINOPHEN LEVEL 0.0 (*)     All other components within normal limits    THYROID STIMULATING HORMONE (SENSITIVE TSH) - Abnormal; Notable for the following components:    TSH 0.302 (*)     All other components within normal limits   CBC WITH DIFF - Abnormal; Notable for the following components:    WBC 11.8 (*)     RBC 5.50 (*)     HGB 16.5 (*)     HCT 48.7 (*)     RDW 16.2 (*)     LYMPHOCYTE % 18 (*)     NEUTROPHIL # 8.69 (*)     All other components within normal limits   URINALYSIS, MACRO/MICRO - Abnormal; Notable for the following components:    LEUKOCYTES Small (*)     BLOOD Trace (*)     All other components within normal limits   ETHANOL, SERUM/PLASMA - Normal   DRUG SCREEN, NO CONFIRMATION, URINE - Normal   SALICYLATE ACID LEVEL - Normal   HCG, SERUM QUALITATIVE, PREGNANCY - Normal   URINE CULTURE,ROUTINE   CBC/DIFF    Narrative:     The following orders were created for panel order CBC/DIFF.  Procedure                               Abnormality         Status                     ---------                               -----------         ------                     CBC WITH QAST[[id number]]                Abnormal            Final result                 Please view results for these tests on the individual orders.  URINALYSIS WITH REFLEX MICROSCOPIC AND CULTURE IF POSITIVE    Narrative:     The following orders were created for panel order URINALYSIS WITH REFLEX MICROSCOPIC AND CULTURE IF POSITIVE.  Procedure                               Abnormality         Status                     ---------                               -----------         ------                     URINALYSIS, MACRO/MICRO[[id number]]      Abnormal            Final result               URINALYSIS, MICROSCOPIC[[id number]]                          In process                   Please view results for these tests on the individual orders.   URINALYSIS, MICROSCOPIC     No orders to display     Medical Decision Making        Medical Decision Making  Patient is 32 year old white female complaining of wanting detox  from fentanyl.  Patient's last use was last night at 11:00 a.m.Marland Kitchen  Patient denies using any other meds or alcohol.  Patient denies any hallucinations auditory or visual.  Patient denies any severe depression or suicidal thoughts.  Patient states she feels very anxious.  Patient needs something the come down from her fentanyl  abuse.  Patient will have labs as well as urinalysis done.  She will then be treated for results of her tests.  [facility] who she has already contacted and told get medically cleared 1st.    Amount and/or Complexity of Data Reviewed  Labs: ordered.    Risk  Prescription drug management.             Medications Administered in the ED   cephalexin (KEFLEX) capsule (has no administration in time range)   ibuprofen (MOTRIN) tablet (800 mg Oral Given 05/06/23 1150)     Clinical Impression   Opiate withdrawal (CMS HCC) (Primary)       Disposition: Discharged               Clinical Impression   Opiate withdrawal (CMS HCC) (Primary)       There are no discharge medications for this patient.

## 2023-05-06 NOTE — ED Nurses Note (Signed)
Patient denies SI or HI. Reports she is here for help with fentanyl withdraws. Reports she last used fentanyl last night at 2300. At present patient is calm and cooperative. Blue scrub and urine specimen cup given.

## 2023-05-09 LAB — URINE CULTURE,ROUTINE: URINE CULTURE: 10000 — AB

## 2023-05-14 ENCOUNTER — Telehealth (HOSPITAL_BASED_OUTPATIENT_CLINIC_OR_DEPARTMENT_OTHER): Payer: Self-pay

## 2023-05-14 NOTE — ED Notes (Signed)
Follow-up 1. Patients number is no longer in service. No further folow-up required.

## 2023-06-26 ENCOUNTER — Emergency Department
Admission: EM | Admit: 2023-06-26 | Discharge: 2023-06-26 | Disposition: A | Discharge status: Rehabilitation Facility | Payer: Self-pay | Attending: Emergency Medicine | Admitting: Emergency Medicine

## 2023-06-26 ENCOUNTER — Other Ambulatory Visit: Payer: Self-pay

## 2023-06-26 DIAGNOSIS — F1990 Other psychoactive substance use, unspecified, uncomplicated: Secondary | ICD-10-CM

## 2023-06-26 DIAGNOSIS — Z3200 Encounter for pregnancy test, result unknown: Secondary | ICD-10-CM | POA: Insufficient documentation

## 2023-06-26 DIAGNOSIS — N390 Urinary tract infection, site not specified: Secondary | ICD-10-CM | POA: Insufficient documentation

## 2023-06-26 DIAGNOSIS — F1110 Opioid abuse, uncomplicated: Secondary | ICD-10-CM | POA: Insufficient documentation

## 2023-06-26 LAB — COMPREHENSIVE METABOLIC PANEL, NON-FASTING
ALBUMIN/GLOBULIN RATIO: 1.3 (ref 0.8–1.4)
ALBUMIN: 4.5 g/dL (ref 3.5–5.7)
ALKALINE PHOSPHATASE: 80 U/L (ref 34–104)
ALT (SGPT): 19 U/L (ref 7–52)
ANION GAP: 7 mmol/L (ref 4–13)
AST (SGOT): 25 U/L (ref 13–39)
BILIRUBIN TOTAL: 0.3 mg/dL (ref 0.3–1.0)
BUN/CREA RATIO: 15 (ref 6–22)
BUN: 10 mg/dL (ref 7–25)
CALCIUM, CORRECTED: 9 mg/dL (ref 8.9–10.8)
CALCIUM: 9.4 mg/dL (ref 8.6–10.3)
CHLORIDE: 108 mmol/L — ABNORMAL HIGH (ref 98–107)
CO2 TOTAL: 20 mmol/L — ABNORMAL LOW (ref 21–31)
CREATININE: 0.67 mg/dL (ref 0.60–1.30)
ESTIMATED GFR: 120 mL/min/{1.73_m2} (ref >59)
GLOBULIN: 3.6 — ABNORMAL HIGH (ref 2.0–3.5)
GLUCOSE: 117 mg/dL — ABNORMAL HIGH (ref 74–109)
OSMOLALITY, CALCULATED: 270 mosm/kg (ref 270–290)
POTASSIUM: 3.7 mmol/L (ref 3.5–5.1)
PROTEIN TOTAL: 8.1 g/dL (ref 6.4–8.9)
SODIUM: 135 mmol/L — ABNORMAL LOW (ref 136–145)

## 2023-06-26 LAB — ETHANOL, SERUM/PLASMA: ETHANOL: <10 mg/dL

## 2023-06-26 LAB — URINALYSIS, MICROSCOPIC
RBCS: 12 /[HPF] — ABNORMAL HIGH (ref <4)
SQUAMOUS EPITHELIAL: 51 /[HPF] — ABNORMAL HIGH (ref <28)
TRANSITIONAL EPITHELIAL CELLS URINE: <1 /[HPF] (ref <6)
WBCS: 24 /[HPF] — ABNORMAL HIGH (ref <6)

## 2023-06-26 LAB — CBC WITH DIFF
BASOPHIL #: 0.1 10*3/uL (ref 0.00–0.10)
BASOPHIL %: 1 % (ref 0–1)
EOSINOPHIL #: 0.2 10*3/uL (ref 0.00–0.50)
EOSINOPHIL %: 2 % (ref 1–7)
HCT: 45 % — ABNORMAL HIGH (ref 31.2–41.9)
HGB: 15.2 g/dL — ABNORMAL HIGH (ref 10.9–14.3)
LYMPHOCYTE #: 3.2 10*3/uL — ABNORMAL HIGH (ref 1.00–3.00)
LYMPHOCYTE %: 34 % (ref 16–44)
MCH: 29.9 pg (ref 24.7–32.8)
MCHC: 33.9 g/dL (ref 32.3–35.6)
MCV: 88.2 fL (ref 75.5–95.3)
MONOCYTE #: 0.5 10*3/uL (ref 0.30–1.00)
MONOCYTE %: 5 % (ref 5–13)
MPV: 9.5 fL (ref 7.9–10.8)
NEUTROPHIL #: 5.5 10*3/uL (ref 1.85–7.80)
NEUTROPHIL %: 58 % (ref 43–77)
PLATELETS: 216 10*3/uL (ref 140–440)
RBC: 5.1 10*6/uL — ABNORMAL HIGH (ref 3.63–4.92)
RDW: 14.1 % (ref 12.3–17.7)
WBC: 9.5 10*3/uL (ref 3.8–11.8)

## 2023-06-26 LAB — THYROID STIMULATING HORMONE WITH FREE T4 REFLEX: TSH: 1.255 u[IU]/mL (ref 0.450–5.330)

## 2023-06-26 LAB — DRUG SCREEN, NO CONFIRMATION, URINE
AMPHETAMINES URINE: POSITIVE — AB
BARBITURATES URINE: NEGATIVE
BENZODIAZEPINES URINE: NEGATIVE
BUPRENORPHINE URINE: NEGATIVE
CANNABINOIDS URINE: NEGATIVE
COCAINE METABOLITES URINE: NEGATIVE
FENTANYL, URINE: POSITIVE — AB
METHADONE URINE: NEGATIVE
OPIATES URINE: NEGATIVE
OXYCODONE URINE: NEGATIVE
PCP URINE: NEGATIVE

## 2023-06-26 LAB — URINALYSIS, MACROSCOPIC
BILIRUBIN: NEGATIVE mg/dL
BLOOD: 0.03 mg/dL
GLUCOSE: NEGATIVE mg/dL
LEUKOCYTES: 250 WBCs/uL — AB
PH: 6 (ref 5.0–9.0)
PROTEIN: 20 mg/dL
SPECIFIC GRAVITY: 1.026 (ref 1.002–1.030)
UROBILINOGEN: NORMAL mg/dL

## 2023-06-26 LAB — HCG, URINE QUALITATIVE, PREGNANCY: HCG URINE QUALITATIVE: NEGATIVE

## 2023-06-26 LAB — ACETAMINOPHEN LEVEL: ACETAMINOPHEN LEVEL: <10 ug/mL — ABNORMAL LOW (ref 10–30)

## 2023-06-26 LAB — SALICYLATE ACID LEVEL: SALICYLATE LEVEL: <3 mg/dL (ref <=30)

## 2023-06-26 MED ORDER — CEFTRIAXONE 1 GRAM SOLUTION FOR INJECTION
INTRAMUSCULAR | Status: AC
Start: 2023-06-26 — End: 2023-06-26
  Filled 2023-06-26: qty 10

## 2023-06-26 MED ORDER — LIDOCAINE HCL 10 MG/ML (1 %) INJECTION SOLUTION
1.0000 g | INTRAMUSCULAR | Status: AC
Start: 2023-06-26 — End: 2023-06-26
  Administered 2023-06-26: 1 g via INTRAMUSCULAR

## 2023-06-26 MED ORDER — LIDOCAINE HCL 10 MG/ML (1 %) INJECTION SOLUTION
INTRAMUSCULAR | Status: AC
Start: 2023-06-26 — End: 2023-06-26
  Filled 2023-06-26: qty 50

## 2023-06-26 MED ORDER — SULFAMETHOXAZOLE 800 MG-TRIMETHOPRIM 160 MG TABLET
1.0000 | ORAL_TABLET | Freq: Two times a day (BID) | ORAL | 0 refills | Status: AC
Start: 2023-06-26 — End: 2023-07-03

## 2023-06-26 NOTE — ED Nurses Note (Signed)
Pt and worker given discharge papers at this time and informed of being medically cleared, verbalized understanding.

## 2023-06-26 NOTE — ED Triage Notes (Signed)
Wants detox from snorting fentanyl. Last used 0300. States already talked to [facility] and they are holding a bed for her. Denies SI/HI.

## 2023-06-26 NOTE — Discharge Instructions (Signed)
Follow up with [facility] facility as arranged immediately upon discharge from this facility.  UR medically cleared, you have urinary tract infection for which you has been prescribed antibiotics.  UR given a shot of Rocephin which is on antibiotics to initiate your urinary tract infection treatment.  He will be discharged on Bactrim DS for 5 days to complete treatment of your urinary tract infection.

## 2023-06-26 NOTE — ED Provider Notes (Signed)
[place]  DEPARTMENT OF EMERGENCY MEDICINE  EMERGENCY DEPARTMENT HISTORY AND PHYSICAL      Chief Complaint:    Patient is a 33 y.o.  female presenting to the ED with chief complaint of medical clearance     History of Present Illness:  33 year old female with history of substance use disorder, has a habit of using fentanyl intranasally, here for medical clearance for detox and drug rehabilitation program at [place].  Patient states that she already had admission set up in the bed at [facility] for admission, only needs to be medically cleared here in the emergency room.  Patient arrived in the company of a social service crisis counselor who apparently has been working with her all day to facilitate the rehabilitation placement.  Patient states she last used fentanyl 3:00 a.m. today.  On exam patient is cooperative, does not appear to be in any acute distress, no opioid withdrawal symptom, otherwise pleasant.    Past Medical History:  Past Medical History:   Diagnosis Date    No pertinent past medical history     Substance abuse (CMS HCC)      No past surgical history on file.  Above history reviewed with patient.  Allergies, medication list, and old records also reviewed.   Patient Active Problem List   Diagnosis    Toxic encephalopathy    Drug overdose of undetermined intent       Social History:  Social History     Tobacco Use    Smoking status: Unknown   Substance Use Topics    Drug use: Yes     Types: IV     Comment: fentanyl positive       Review of Systems:  Constitutional: No fever, or chills  Skin: No rashes or lesions  Resp: Denies cough, or wheezes  Card: Denies chest pain or palpitations  GI: Denies nausea, vomiting, or diarrhea, no abdominal pain  MSK: Denies any pain or injury  Neuro: Denies any focal weakness  All other systems were reviewed and were negative except for what is mentioned in the HPI.    Physical Exam:   Filed Vitals:    06/26/23 2027   BP: 108/72   Pulse: 76   Resp: 19   Temp: 36.8  C (98.3 F)   SpO2: 99%     Nursing note and vitals reviewed.  Vital signs reviewed as above. No acute distress.   Constitutional: Pt is well-developed and well-nourished.   Head: Normocephalic and atraumatic.   Eyes: Conjunctivae are normal. Pupils are equal, round, and reactive to light.   Mouth: Moist, without erythema  Heart: Normal rate  Pulmonary/Chest: Non-labored respirations, clear to auscultation  Abdomen: Soft, active bowel sounds, diffusely nontender, no guarding, no mass, no organomegaly.  Musculoskeletal:  No obvious deformities.   Neurological: Alert and oriented, no obvious focal deficits  Skin: No rash  Psychiatric: Patient has a normal mood and affect.       Labs:  Results for orders placed or performed during the hospital encounter of 06/26/23   ACETAMINOPHEN LEVEL   Result Value Ref Range    ACETAMINOPHEN LEVEL <10 (L) 10 - 30 ug/mL   COMPREHENSIVE METABOLIC PANEL, NON-FASTING   Result Value Ref Range    SODIUM 135 (L) 136 - 145 mmol/L    POTASSIUM 3.7 3.5 - 5.1 mmol/L    CHLORIDE 108 (H) 98 - 107 mmol/L    CO2 TOTAL 20 (L) 21 - 31 mmol/L    ANION  GAP 7 4 - 13 mmol/L    BUN 10 7 - 25 mg/dL    CREATININE 6.21 3.08 - 1.30 mg/dL    BUN/CREA RATIO 15 6 - 22    ESTIMATED GFR 120 >59 mL/min/1.83m^2    ALBUMIN 4.5 3.5 - 5.7 g/dL    CALCIUM 9.4 8.6 - 65.7 mg/dL    GLUCOSE 846 (H) 74 - 109 mg/dL    ALKALINE PHOSPHATASE 80 34 - 104 U/L    ALT (SGPT) 19 7 - 52 U/L    AST (SGOT) 25 13 - 39 U/L    BILIRUBIN TOTAL 0.3 0.3 - 1.0 mg/dL    PROTEIN TOTAL 8.1 6.4 - 8.9 g/dL    ALBUMIN/GLOBULIN RATIO 1.3 0.8 - 1.4    OSMOLALITY, CALCULATED 270 270 - 290 mOsm/kg    CALCIUM, CORRECTED 9.0 8.9 - 10.8 mg/dL    GLOBULIN 3.6 (H) 2.0 - 3.5   ETHANOL, SERUM   Result Value Ref Range    ETHANOL <10 0 mg/dL   SALICYLATE ACID LEVEL   Result Value Ref Range    SALICYLATE LEVEL <3 <=30 mg/dL   THYROID STIMULATING HORMONE WITH FREE T4 REFLEX   Result Value Ref Range    TSH 1.255 0.450 - 5.330 uIU/mL   DRUG SCREEN, NO  CONFIRMATION, URINE   Result Value Ref Range    AMPHETAMINES URINE Positive (A) Negative    BARBITURATES URINE Negative Negative    BENZODIAZEPINES URINE Negative Negative    BUPRENORPHINE URINE Negative Negative    CANNABINOIDS URINE Negative Negative    COCAINE METABOLITES URINE Negative Negative    FENTANYL, URINE Positive (A) Negative    OPIATES URINE Negative Negative    OXYCODONE URINE Negative Negative    PCP URINE Negative Negative    METHADONE URINE Negative Negative   CBC WITH DIFF   Result Value Ref Range    WBC 9.5 3.8 - 11.8 x10^3/uL    RBC 5.10 (H) 3.63 - 4.92 x10^6/uL    HGB 15.2 (H) 10.9 - 14.3 g/dL    HCT 96.2 (H) 95.2 - 41.9 %    MCV 88.2 75.5 - 95.3 fL    MCH 29.9 24.7 - 32.8 pg    MCHC 33.9 32.3 - 35.6 g/dL    RDW 84.1 32.4 - 40.1 %    PLATELETS 216 140 - 440 x10^3/uL    MPV 9.5 7.9 - 10.8 fL    NEUTROPHIL % 58 43 - 77 %    LYMPHOCYTE % 34 16 - 44 %    MONOCYTE % 5 5 - 13 %    EOSINOPHIL % 2 1 - 7 %    BASOPHIL % 1 0 - 1 %    NEUTROPHIL # 5.50 1.85 - 7.80 x10^3/uL    LYMPHOCYTE # 3.20 (H) 1.00 - 3.00 x10^3/uL    MONOCYTE # 0.50 0.30 - 1.00 x10^3/uL    EOSINOPHIL # 0.20 0.00 - 0.50 x10^3/uL    BASOPHIL # 0.10 0.00 - 0.10 x10^3/uL   URINALYSIS, MACROSCOPIC   Result Value Ref Range    COLOR Yellow Colorless, Light Yellow, Yellow    APPEARANCE Turbid (A) Clear    SPECIFIC GRAVITY 1.026 1.002 - 1.030    PH 6.0 5.0 - 9.0    LEUKOCYTES 250 (A) Negative, 100  WBCs/uL    NITRITE 2+ (A) Negative    PROTEIN 20 Negative, 10 , 20  mg/dL    GLUCOSE Negative Negative, 30  mg/dL    KETONES  Trace Negative, Trace mg/dL    BILIRUBIN Negative Negative, 0.5 mg/dL    BLOOD 2.95 Negative, 0.03 mg/dL    UROBILINOGEN Normal Normal mg/dL   URINALYSIS, MICROSCOPIC   Result Value Ref Range    BACTERIA Occasional (A) Negative /hpf    MUCOUS Many (A) Rare, Occasional, Few /hpf    RBCS 12 (H) <4 /hpf    WBCS 24 (H) <6 /hpf    SQUAMOUS EPITHELIAL 51 (H) <28 /hpf    TRANSITIONAL EPITHELIAL CELLS URINE <1 <6 /hpf   HCG, URINE  QUALITATIVE, PREGNANCY   Result Value Ref Range    HCG URINE QUALITATIVE Negative        Imaging:       Orders Placed This Encounter    URINE CULTURE,ROUTINE    ACETAMINOPHEN LEVEL    CBC/DIFF    COMPREHENSIVE METABOLIC PANEL, NON-FASTING    ETHANOL, SERUM    SALICYLATE ACID LEVEL    THYROID STIMULATING HORMONE WITH FREE T4 REFLEX    DRUG SCREEN, NO CONFIRMATION, URINE    URINALYSIS, MACROSCOPIC AND MICROSCOPIC W/CULTURE REFLEX    CANCELED: HCG, SERUM QUALITATIVE, PREGNANCY    CBC WITH DIFF    URINALYSIS, MACROSCOPIC    URINALYSIS, MICROSCOPIC    HCG, URINE QUALITATIVE, PREGNANCY    CANCELED: HCG, URINE QUALITATIVE, PREGNANCY    cefTRIAXone (ROCEPHIN) 1 g in lidocaine 2.86 mL (tot vol) IM injection    trimethoprim-sulfamethoxazole (BACTRIM DS) 160-800mg  per tablet         MDM:  ED Course as of 06/26/23 2255   Sat Jun 26, 2023   2248 Patient is medically cleared other than urinary tract infection.  1st dose treatment was given with Rocephin 1 g IM will be discharged home on Bactrim for 5 days for continuity treatment of the urinary tract infection.   2249 At this point are pink patient is medically cleared and ready for rehab placement and will be discharged.         During the patient's stay in the emergency department, the above listed imaging and/or labs were performed to assist with medical decision making and were reviewed by myself as available for review.   Patient rechecked and remained stable throughout the emergency department course.     Results discussed with patient.   All questions/concerns addressed, and patient agrees with disposition plan.   Advised that patient return to ED immediately for any new or worsening symptoms; otherwise, follow up with PCP.    IMPRESSION:   Clinical Impression   Urinary tract infection (Primary)   Substance use disorder           DISPOSITION/PLAN:    Discharged    Follow up with [facility] detox facility as planned upon discharge from the emergency room.  Today  ED  FOLLOW-UP

## 2023-06-27 NOTE — ED Notes (Signed)
[facility] - Emergency Department  Peer Recovery Coach Assessment    Initial Evaluation                         Substance Use History  Patient current substance use status: Patient requesting detox at [facility], has been snorting fentanyl.  Last use was at 3am on 1/18.              Within the last 30 days, what substances has the patient used?: Opiates  When was the patient's last use of opiates/heroin?: 0-24 hours         Does patient want opiate use treatment?: Yes         Family, Social, Home & Safety History                                                 Employment            Engagement  Readiness ruler: 7  Summary of assessment priority areas: Substance abuse treatment    Brief Intervention  Discussed plan to reduce/quit substance use?: Yes  Discussed willingness to enter treatment?: Yes  Indicated patient's stage of change:: 3 - Preparation    Patient seen by Peer Recovery Coach and is a candidate for buprenorphine administration in the ED. Patient needs assessment for bup treatment.: Yes    Plan  Was the patient referred to treatment?: Yes  What level of treatment was the patient referred to?: Medically Managed Inpatient  Program name: [facility]    Was patient referred to physician for Buprenorphine Assessment in the ED?: Yes    Did patient receive Narcan in the ED?: No         Follow-up  Patient admitted for treatment?: Yes  Treatment center patient admitted to:: [facility]  Date of intake appointment: 06/26/23  Did patient attend intake appointment?: Yes  Need for additional follow-up?: Yes       Trina Ao, Peer Recovery Coach 06/27/2023 08:43

## 2023-06-28 ENCOUNTER — Other Ambulatory Visit (HOSPITAL_COMMUNITY): Payer: Self-pay | Admitting: Nurse Practitioner

## 2023-06-28 LAB — URINE CULTURE,ROUTINE: URINE CULTURE: >100000 — AB

## 2023-06-29 ENCOUNTER — Other Ambulatory Visit (HOSPITAL_COMMUNITY): Payer: Self-pay | Admitting: Nurse Practitioner

## 2023-06-29 MED ORDER — NITROFURANTOIN MONOHYDRATE/MACROCRYSTALS 100 MG CAPSULE
100.0000 mg | ORAL_CAPSULE | Freq: Two times a day (BID) | ORAL | 0 refills | Status: AC
Start: 2023-06-29 — End: 2023-07-06

## 2023-06-30 ENCOUNTER — Encounter (HOSPITAL_COMMUNITY): Payer: Self-pay | Admitting: Nurse Practitioner

## 2023-07-21 ENCOUNTER — Telehealth (HOSPITAL_COMMUNITY): Payer: Self-pay

## 2023-07-21 NOTE — ED Notes (Signed)
Two week follow up with patient.
